# Patient Record
Sex: Female | Born: 1960 | Race: White | Hispanic: No | Marital: Married | Smoking: Never smoker
Health system: Southern US, Community
[De-identification: ages and names within clinical notes are randomized; demographics above are authoritative.]

## PROBLEM LIST (undated history)

## (undated) DIAGNOSIS — B019 Varicella without complication: Secondary | ICD-10-CM

## (undated) DIAGNOSIS — R51 Headache: Secondary | ICD-10-CM

## (undated) DIAGNOSIS — M1990 Unspecified osteoarthritis, unspecified site: Secondary | ICD-10-CM

## (undated) DIAGNOSIS — F329 Major depressive disorder, single episode, unspecified: Secondary | ICD-10-CM

## (undated) DIAGNOSIS — J439 Emphysema, unspecified: Secondary | ICD-10-CM

## (undated) DIAGNOSIS — F419 Anxiety disorder, unspecified: Secondary | ICD-10-CM

## (undated) DIAGNOSIS — T7840XA Allergy, unspecified, initial encounter: Secondary | ICD-10-CM

## (undated) DIAGNOSIS — D219 Benign neoplasm of connective and other soft tissue, unspecified: Secondary | ICD-10-CM

## (undated) DIAGNOSIS — E785 Hyperlipidemia, unspecified: Secondary | ICD-10-CM

## (undated) DIAGNOSIS — K219 Gastro-esophageal reflux disease without esophagitis: Secondary | ICD-10-CM

## (undated) DIAGNOSIS — F32A Depression, unspecified: Secondary | ICD-10-CM

## (undated) DIAGNOSIS — G43909 Migraine, unspecified, not intractable, without status migrainosus: Secondary | ICD-10-CM

## (undated) DIAGNOSIS — N809 Endometriosis, unspecified: Secondary | ICD-10-CM

## (undated) DIAGNOSIS — N83209 Unspecified ovarian cyst, unspecified side: Secondary | ICD-10-CM

## (undated) DIAGNOSIS — N390 Urinary tract infection, site not specified: Secondary | ICD-10-CM

## (undated) DIAGNOSIS — R519 Headache, unspecified: Secondary | ICD-10-CM

## (undated) HISTORY — DX: Anxiety disorder, unspecified: F41.9

## (undated) HISTORY — DX: Migraine, unspecified, not intractable, without status migrainosus: G43.909

## (undated) HISTORY — DX: Depression, unspecified: F32.A

## (undated) HISTORY — DX: Headache, unspecified: R51.9

## (undated) HISTORY — DX: Hyperlipidemia, unspecified: E78.5

## (undated) HISTORY — DX: Unspecified ovarian cyst, unspecified side: N83.209

## (undated) HISTORY — DX: Emphysema, unspecified: J43.9

## (undated) HISTORY — DX: Varicella without complication: B01.9

## (undated) HISTORY — DX: Gastro-esophageal reflux disease without esophagitis: K21.9

## (undated) HISTORY — DX: Allergy, unspecified, initial encounter: T78.40XA

## (undated) HISTORY — DX: Benign neoplasm of connective and other soft tissue, unspecified: D21.9

## (undated) HISTORY — DX: Headache: R51

## (undated) HISTORY — DX: Major depressive disorder, single episode, unspecified: F32.9

## (undated) HISTORY — DX: Unspecified osteoarthritis, unspecified site: M19.90

## (undated) HISTORY — DX: Urinary tract infection, site not specified: N39.0

## (undated) HISTORY — DX: Endometriosis, unspecified: N80.9

---

## 1996-06-25 HISTORY — PX: BUNIONECTOMY: SHX129

## 2000-11-25 HISTORY — PX: ABDOMINAL HYSTERECTOMY: SHX81

## 2006-09-04 DIAGNOSIS — H1045 Other chronic allergic conjunctivitis: Secondary | ICD-10-CM | POA: Insufficient documentation

## 2006-09-04 DIAGNOSIS — E782 Mixed hyperlipidemia: Secondary | ICD-10-CM | POA: Insufficient documentation

## 2006-09-04 DIAGNOSIS — N959 Unspecified menopausal and perimenopausal disorder: Secondary | ICD-10-CM | POA: Insufficient documentation

## 2006-09-04 DIAGNOSIS — L408 Other psoriasis: Secondary | ICD-10-CM | POA: Insufficient documentation

## 2008-11-14 DIAGNOSIS — E559 Vitamin D deficiency, unspecified: Secondary | ICD-10-CM | POA: Insufficient documentation

## 2016-02-22 DIAGNOSIS — M159 Polyosteoarthritis, unspecified: Secondary | ICD-10-CM | POA: Insufficient documentation

## 2016-05-08 LAB — BASIC METABOLIC PANEL
BUN: 12 (ref 4–21)
Creatinine: 0.8 (ref 0.5–1.1)
GLUCOSE: 87
Potassium: 4.2 (ref 3.4–5.3)
Sodium: 142 (ref 137–147)

## 2016-05-08 LAB — LIPID PANEL
Cholesterol: 172 (ref 0–200)
HDL: 50 (ref 35–70)
LDL CALC: 85
TRIGLYCERIDES: 183 — AB (ref 40–160)

## 2016-05-08 LAB — HEPATIC FUNCTION PANEL
ALK PHOS: 86 (ref 25–125)
ALT: 19 (ref 7–35)
AST: 19 (ref 13–35)
BILIRUBIN, TOTAL: 0.3

## 2018-01-07 ENCOUNTER — Encounter: Payer: Self-pay | Admitting: Family Medicine

## 2018-01-07 ENCOUNTER — Ambulatory Visit (INDEPENDENT_AMBULATORY_CARE_PROVIDER_SITE_OTHER): Payer: 59 | Admitting: Family Medicine

## 2018-01-07 VITALS — BP 134/80 | HR 80 | Ht 66.0 in | Wt 164.1 lb

## 2018-01-07 DIAGNOSIS — E7800 Pure hypercholesterolemia, unspecified: Secondary | ICD-10-CM

## 2018-01-07 DIAGNOSIS — Z0000 Encounter for general adult medical examination without abnormal findings: Secondary | ICD-10-CM

## 2018-01-07 LAB — LIPID PANEL
CHOL/HDL RATIO: 4
Cholesterol: 162 mg/dL (ref 0–200)
HDL: 45.4 mg/dL (ref >39.00)
LDL Cholesterol: 91 mg/dL (ref 0–99)
NONHDL: 117.05
Triglycerides: 131 mg/dL (ref 0.0–149.0)
VLDL: 26.2 mg/dL (ref 0.0–40.0)

## 2018-01-07 LAB — URINALYSIS, ROUTINE W REFLEX MICROSCOPIC
BILIRUBIN URINE: NEGATIVE
Ketones, ur: NEGATIVE
LEUKOCYTES UA: NEGATIVE
Nitrite: NEGATIVE
PH: 6.5 (ref 5.0–8.0)
RBC / HPF: NONE SEEN (ref 0–?)
Specific Gravity, Urine: <=1.005 — AB (ref 1.000–1.030)
TOTAL PROTEIN, URINE-UPE24: NEGATIVE
Urine Glucose: NEGATIVE
Urobilinogen, UA: 0.2 (ref 0.0–1.0)
WBC UA: NONE SEEN (ref 0–?)

## 2018-01-07 LAB — COMPREHENSIVE METABOLIC PANEL
ALT: 28 U/L (ref 0–35)
AST: 27 U/L (ref 0–37)
Albumin: 4.1 g/dL (ref 3.5–5.2)
Alkaline Phosphatase: 73 U/L (ref 39–117)
BUN: 14 mg/dL (ref 6–23)
CO2: 29 meq/L (ref 19–32)
Calcium: 9.1 mg/dL (ref 8.4–10.5)
Chloride: 101 mEq/L (ref 96–112)
Creatinine, Ser: 0.69 mg/dL (ref 0.40–1.20)
GFR: 92.83 mL/min (ref >60.00)
GLUCOSE: 91 mg/dL (ref 70–99)
POTASSIUM: 4.2 meq/L (ref 3.5–5.1)
Sodium: 136 mEq/L (ref 135–145)
Total Bilirubin: 0.5 mg/dL (ref 0.2–1.2)
Total Protein: 7.2 g/dL (ref 6.0–8.3)

## 2018-01-07 LAB — CBC
HCT: 39.7 % (ref 36.0–46.0)
HEMOGLOBIN: 13.6 g/dL (ref 12.0–15.0)
MCHC: 34.2 g/dL (ref 30.0–36.0)
MCV: 89.9 fl (ref 78.0–100.0)
PLATELETS: 253 10*3/uL (ref 150.0–400.0)
RBC: 4.41 Mil/uL (ref 3.87–5.11)
RDW: 13 % (ref 11.5–15.5)
WBC: 5 10*3/uL (ref 4.0–10.5)

## 2018-01-07 LAB — TSH: TSH: 1.22 u[IU]/mL (ref 0.35–4.50)

## 2018-01-07 NOTE — Progress Notes (Addendum)
Subjective:  Patient ID: Tara Avery, female    DOB: 1960-12-25  Age: 57 y.o. MRN: [id number]  CC: Establish Care   HPI Tara Avery presents for establishment of care and for follow-up of her elevated cholesterol.  She has been taking Lipitor for ever.  She has never had a stroke or heart attack.  She has a strong family history of a vascular disease.  She has never smoked.  She does not drink alcohol or use illicit drugs.  She and her husband moved into this area recently from the [employer] area.  She is retired from office work.  Her husband continues to work in this area.  They have a 51 year old daughter who lives in [place].  Patient admits that she has gained some weight and is not currently exercising.  She has a past medical history of osteoarthritis involving her hands and knees.  She has a history of thoracic outlet syndrome.  She is status post hysterectomy and 5 years of Premarin therapy.  She is dealing with hot flashes.  She has a history of breast lumps that have been benign through ultrasound studies.  She had a colonoscopy in 2012 that was normal she is not due for another 2022.  She has a history of ongoing allergy and platelet damp weather we have been experiencing in this area.  She uses Zyrtec and Flonase for this.  She experiences pain and stiffness in her MCPs and her knees.  She has tested negative for rheumatoid arthritis.  She has seen a rheumatologist in the past and has been diagnosed with osteoarthritis.  Blood work from 2 years ago shows normal CMP and lipid profiles. History Tara Avery has a past medical history of Allergy, Arthritis, Chicken pox, Depression, Emphysema of lung ([place]), Frequent headaches, GERD (gastroesophageal reflux disease), Hyperlipidemia, Migraine, and Urinary tract infection.   She has a past surgical history that includes Bunionectomy (Left, 06/1996) and Abdominal hysterectomy (2002).   Her family history includes Alcohol abuse in  her father and mother; Arthritis in her mother and sister; Depression in her mother and sister; Diabetes in her sister; Early death in her father; Heart attack in her maternal grandfather and sister; Hyperlipidemia in her maternal grandfather, mother, and sister; Hypertension in her maternal grandfather, mother, and sister; Kidney disease in her mother; Miscarriages / Stillbirths in her sister; Stroke in her sister.She reports that  has never smoked. she has never used smokeless tobacco. She reports that she does not drink alcohol or use drugs.  Outpatient Medications Prior to Visit  Medication Sig Dispense Refill  . cetirizine (ZYRTEC) 10 MG tablet Take 10 mg by mouth daily.    . Cholecalciferol (VITAMIN D3) 2000 units TABS Take 1 tablet by mouth daily.    . diphenhydrAMINE (BENADRYL) 25 MG tablet Take 25 mg by mouth as needed.    . fluticasone (FLONASE) 50 MCG/ACT nasal spray Place 1 spray into both nostrils as needed for allergies or rhinitis.    Marland Kitchen ibuprofen (ADVIL,MOTRIN) 200 MG tablet Take 200 mg by mouth as needed.    . Multiple Vitamins-Minerals (CENTRUM WOMEN PO) Take 0.5 tablets by mouth daily.    . Simethicone (GAS-X PO) Take 1 tablet by mouth as needed.    Marland Kitchen atorvastatin (LIPITOR) 10 MG tablet Take 10 mg by mouth daily.     No facility-administered medications prior to visit.     ROS Review of Systems  Constitutional: Negative for chills, fatigue, fever and unexpected weight change.  HENT: Positive for congestion, postnasal drip and rhinorrhea.   Eyes: Negative for photophobia and visual disturbance.  Respiratory: Negative.   Cardiovascular: Negative.   Endocrine: Negative for polyphagia and polyuria.  Genitourinary: Negative.   Musculoskeletal: Positive for arthralgias.  Skin: Negative for rash.  Allergic/Immunologic: Negative for immunocompromised state.  Neurological: Negative for facial asymmetry, speech difficulty and weakness.  Hematological: Does not bruise/bleed  easily.  Psychiatric/Behavioral: Negative.     Objective:  BP 134/80 (BP Location: Left Arm, Patient Position: Sitting, Cuff Size: Normal)   Pulse 80   Ht 5\' 6"  (1.676 m)   Wt 164 lb 2 oz (74.4 kg)   SpO2 99%   BMI 26.49 kg/m   Physical Exam  Constitutional: She is oriented to person, place, and time. She appears well-developed and well-nourished. No distress.  HENT:  Head: Normocephalic and atraumatic.  Right Ear: Tympanic membrane, external ear and ear canal normal.  Left Ear: Tympanic membrane, external ear and ear canal normal.  Mouth/Throat: Oropharynx is clear and moist. No oropharyngeal exudate.  Eyes: Conjunctivae are normal. Pupils are equal, round, and reactive to light. Right eye exhibits no discharge. Left eye exhibits no discharge. No scleral icterus.  Neck: Neck supple. No JVD present. No tracheal deviation present. No thyromegaly present.  Cardiovascular: Normal rate, regular rhythm and normal heart sounds.  Pulmonary/Chest: Effort normal and breath sounds normal. No stridor.  Abdominal: Soft. Bowel sounds are normal.  Lymphadenopathy:    She has no cervical adenopathy.  Neurological: She is alert and oriented to person, place, and time.  Skin: Skin is dry. She is not diaphoretic.  Psychiatric: She has a normal mood and affect. Her behavior is normal.      Assessment & Plan:   Tara Avery was seen today for establish care.  Diagnoses and all orders for this visit:  Elevated LDL cholesterol level -     Comprehensive metabolic panel -     Lipid panel -     TSH -     atorvastatin (LIPITOR) 10 MG tablet; Take 1 tablet (10 mg total) by mouth daily.  Health care maintenance -     CBC -     Comprehensive metabolic panel -     Hepatitis C antibody -     HIV antibody -     TSH -     Urinalysis, Routine w reflex microscopic -     DG Bone Density; Future -     MM Digital Screening; Future   I have changed Tara Avery's atorvastatin. I am also having her  maintain her cetirizine, Vitamin D3, Multiple Vitamins-Minerals (CENTRUM WOMEN PO), ibuprofen, Simethicone (GAS-X PO), fluticasone, and diphenhydrAMINE.  Meds ordered this encounter  Medications  . atorvastatin (LIPITOR) 10 MG tablet    Sig: Take 1 tablet (10 mg total) by mouth daily.    Dispense:  100 tablet    Refill:  3   Health care maintenance orders are listed above.  She will schedule a Pap smear with her GYN provider of choice.   follow-up suggested will pending results of lab work.  She will need a refill on her Lipitor.  Suggested that she might develop some social outlets with her chosen faith community and/or the [facility].  Follow-up: Return if symptoms worsen or fail to improve.  Libby Maw, MD

## 2018-01-08 ENCOUNTER — Encounter: Payer: Self-pay | Admitting: Family Medicine

## 2018-01-08 LAB — HEPATITIS C ANTIBODY
HEP C AB: NONREACTIVE
SIGNAL TO CUT-OFF: 0.01 (ref <1.00)

## 2018-01-08 LAB — HIV ANTIBODY (ROUTINE TESTING W REFLEX): HIV: NONREACTIVE

## 2018-01-08 MED ORDER — ATORVASTATIN CALCIUM 10 MG PO TABS: 10.0000 mg | ORAL_TABLET | Freq: Every day | ORAL | 3 refills | Status: DC

## 2018-01-08 NOTE — Addendum Note (Signed)
Addended by: Jon Billings on: 01/08/2018 02:02 PM   Modules accepted: Orders

## 2018-01-16 ENCOUNTER — Inpatient Hospital Stay: Admission: RE | Admit: 2018-01-16 | Payer: 59 | Source: Ambulatory Visit

## 2018-06-25 ENCOUNTER — Encounter: Payer: Self-pay | Admitting: Family Medicine

## 2018-06-25 ENCOUNTER — Telehealth: Payer: Self-pay

## 2018-06-25 NOTE — Telephone Encounter (Signed)
Pt called back and is [facility]

## 2018-06-25 NOTE — Telephone Encounter (Signed)
Pt is checking on mammogram order that was placed in Feb 2019. Please call the pt.

## 2018-07-23 ENCOUNTER — Ambulatory Visit
Admission: RE | Admit: 2018-07-23 | Discharge: 2018-07-23 | Disposition: A | Discharge status: Home / Self Care | Payer: 59 | Source: Ambulatory Visit | Attending: Family Medicine | Admitting: Family Medicine

## 2018-07-23 DIAGNOSIS — Z0000 Encounter for general adult medical examination without abnormal findings: Secondary | ICD-10-CM

## 2018-09-08 ENCOUNTER — Encounter: Payer: Self-pay | Admitting: Family Medicine

## 2019-02-07 ENCOUNTER — Other Ambulatory Visit: Payer: Self-pay | Admitting: Family Medicine

## 2019-02-07 DIAGNOSIS — E7800 Pure hypercholesterolemia, unspecified: Secondary | ICD-10-CM

## 2019-03-03 ENCOUNTER — Other Ambulatory Visit: Payer: Self-pay | Admitting: Family Medicine

## 2019-03-03 DIAGNOSIS — E7800 Pure hypercholesterolemia, unspecified: Secondary | ICD-10-CM

## 2019-03-12 ENCOUNTER — Telehealth: Payer: Self-pay | Admitting: Family Medicine

## 2019-03-12 NOTE — Telephone Encounter (Signed)
Called patient but phone number is not in service. Calling to schedule virtual visit with Tara Avery.

## 2019-03-30 ENCOUNTER — Other Ambulatory Visit: Payer: Self-pay | Admitting: Family Medicine

## 2019-03-30 DIAGNOSIS — E7800 Pure hypercholesterolemia, unspecified: Secondary | ICD-10-CM

## 2019-05-15 ENCOUNTER — Other Ambulatory Visit: Payer: Self-pay | Admitting: Family Medicine

## 2019-05-15 DIAGNOSIS — E7800 Pure hypercholesterolemia, unspecified: Secondary | ICD-10-CM

## 2019-05-17 ENCOUNTER — Encounter: Payer: Self-pay | Admitting: Family Medicine

## 2019-05-20 ENCOUNTER — Other Ambulatory Visit: Payer: Self-pay

## 2019-05-20 ENCOUNTER — Ambulatory Visit (INDEPENDENT_AMBULATORY_CARE_PROVIDER_SITE_OTHER): Payer: 59 | Admitting: Family Medicine

## 2019-05-20 ENCOUNTER — Encounter: Payer: Self-pay | Admitting: Family Medicine

## 2019-05-20 ENCOUNTER — Ambulatory Visit (INDEPENDENT_AMBULATORY_CARE_PROVIDER_SITE_OTHER): Payer: 59

## 2019-05-20 VITALS — BP 128/80 | HR 87 | Ht 66.0 in | Wt 159.5 lb

## 2019-05-20 DIAGNOSIS — M5432 Sciatica, left side: Secondary | ICD-10-CM | POA: Insufficient documentation

## 2019-05-20 DIAGNOSIS — Z872 Personal history of diseases of the skin and subcutaneous tissue: Secondary | ICD-10-CM | POA: Diagnosis not present

## 2019-05-20 DIAGNOSIS — J309 Allergic rhinitis, unspecified: Secondary | ICD-10-CM | POA: Diagnosis not present

## 2019-05-20 DIAGNOSIS — Z862 Personal history of diseases of the blood and blood-forming organs and certain disorders involving the immune mechanism: Secondary | ICD-10-CM | POA: Insufficient documentation

## 2019-05-20 DIAGNOSIS — Z0000 Encounter for general adult medical examination without abnormal findings: Secondary | ICD-10-CM

## 2019-05-20 DIAGNOSIS — E7800 Pure hypercholesterolemia, unspecified: Secondary | ICD-10-CM

## 2019-05-20 DIAGNOSIS — M2550 Pain in unspecified joint: Secondary | ICD-10-CM | POA: Insufficient documentation

## 2019-05-20 MED ORDER — PREDNISONE 10 MG (48) PO TBPK: ORAL_TABLET | ORAL | 0 refills | Status: DC

## 2019-05-20 MED ORDER — ATORVASTATIN CALCIUM 10 MG PO TABS: 10.0000 mg | ORAL_TABLET | Freq: Every day | ORAL | 2 refills | Status: DC

## 2019-05-20 MED ORDER — LEVOCETIRIZINE DIHYDROCHLORIDE 5 MG PO TABS: 5.0000 mg | ORAL_TABLET | Freq: Every evening | ORAL | 1 refills | Status: DC

## 2019-05-20 NOTE — Addendum Note (Signed)
Addended by: Lynnea Ferrier on: 05/20/2019 11:12 AM   Modules accepted: Orders

## 2019-05-20 NOTE — Progress Notes (Signed)
Established Patient Office Visit  Subjective:  Patient ID: Tara Avery, female    DOB: Sep 25, 1961  Age: 58 y.o. MRN: [id number]  CC:  Chief Complaint  Patient presents with  . Annual Exam    HPI [name] presents for follow-up of multiple medical issues and complaints.  She does continue to take the atorvastatin at low dose without issue.  Cholesterol has been controlled.  She continues to have allergy rhinitis symptoms.  She is taking Zyrtec and Benadryl at at bedtime as needed.  She continues to use nasal saline and Flonase.  Unfortunately the Flonase is irritated her nose and she occasionally experiences epistaxis.  She uses it every third day with some help.  She is having multiple joint aches and pains to include her hands her knees or feet among others.  There is some stiffness in the morning but not excessive.  Has tested negative in the past for rheumatoid arthritis.  She does have a past medical history of psoriasis that is currently quiesced sent.  She has a past medical history of sarcoidosis she tells me that was treated briefly with prednisone when she was in her 28s.  She has a history of granuloma annulare.  She has been having some problems with lower back pain radiating down around her left hip.  She has been stressed and sad with the current pandemic but denies frank depression.  She has been having troubles with some vaginal dryness and urinary incontinence.  She is also having hot flashes.  She has taken estrogen in the past.  She is status post hysterectomy.  No GYN follow-up in 3 years.  Past Medical History:  Diagnosis Date  . Allergy   . Arthritis   . Chicken pox   . Depression   . Emphysema of lung ([place])    when young  . Frequent headaches   . GERD (gastroesophageal reflux disease)   . Hyperlipidemia   . Migraine   . Urinary tract infection     Past Surgical History:  Procedure Laterality Date  . ABDOMINAL HYSTERECTOMY  2002   partial  .  BUNIONECTOMY Left 06/1996    Family History  Problem Relation Age of Onset  . Alcohol abuse Mother   . Arthritis Mother   . Depression Mother   . Hyperlipidemia Mother   . Hypertension Mother   . Kidney disease Mother   . Alcohol abuse Father   . Early death Father   . Depression Sister   . Diabetes Sister   . Heart attack Sister   . Hyperlipidemia Sister   . Hypertension Sister   . Miscarriages / Stillbirths Sister   . Stroke Sister   . Heart attack Maternal Grandfather   . Hypertension Maternal Grandfather   . Hyperlipidemia Maternal Grandfather   . Arthritis Sister     Social History   Socioeconomic History  . Marital status: Married    Spouse name: Not on file  . Number of children: Not on file  . Years of education: Not on file  . Highest education level: Not on file  Occupational History  . Not on file  Social Needs  . Financial resource strain: Not on file  . Food insecurity    Worry: Not on file    Inability: Not on file  . Transportation needs    Medical: Not on file    Non-medical: Not on file  Tobacco Use  . Smoking status: Never Smoker  . Smokeless tobacco: Never  Used  Substance and Sexual Activity  . Alcohol use: No    Frequency: Never  . Drug use: No  . Sexual activity: Yes    Partners: Male  Lifestyle  . Physical activity    Days per week: Not on file    Minutes per session: Not on file  . Stress: Not on file  Relationships  . Social Herbalist on phone: Not on file    Gets together: Not on file    Attends religious service: Not on file    Active member of club or organization: Not on file    Attends meetings of clubs or organizations: Not on file    Relationship status: Not on file  . Intimate partner violence    Fear of current or ex partner: Not on file    Emotionally abused: Not on file    Physically abused: Not on file    Forced sexual activity: Not on file  Other Topics Concern  . Not on file  Social History  Narrative  . Not on file    Outpatient Medications Prior to Visit  Medication Sig Dispense Refill  . cetirizine (ZYRTEC) 10 MG tablet Take 10 mg by mouth daily.    . Cholecalciferol (VITAMIN D3) 2000 units TABS Take 1 tablet by mouth daily.    . diphenhydrAMINE (BENADRYL) 25 MG tablet Take 25 mg by mouth as needed.    . fluticasone (FLONASE) 50 MCG/ACT nasal spray Place 1 spray into both nostrils as needed for allergies or rhinitis.    Marland Kitchen ibuprofen (ADVIL,MOTRIN) 200 MG tablet Take 200 mg by mouth as needed.    . Multiple Vitamins-Minerals (CENTRUM WOMEN PO) Take 0.5 tablets by mouth daily.    . Simethicone (GAS-X PO) Take 1 tablet by mouth as needed.    Marland Kitchen atorvastatin (LIPITOR) 10 MG tablet TAKE 1 TABLET BY MOUTH EVERY DAY 30 tablet 1   No facility-administered medications prior to visit.     Allergies  Allergen Reactions  . Codeine   . Latex     ROS Review of Systems  Constitutional: Negative for chills, diaphoresis, fatigue, fever and unexpected weight change.  HENT: Positive for nosebleeds, postnasal drip, rhinorrhea and sneezing. Negative for sinus pressure, sinus pain and sore throat.   Eyes: Negative for photophobia and visual disturbance.  Respiratory: Positive for cough. Negative for chest tightness and shortness of breath.   Cardiovascular: Negative.   Gastrointestinal: Negative.   Endocrine: Negative for polyphagia and polyuria.  Genitourinary: Negative.   Musculoskeletal: Positive for arthralgias, back pain and myalgias. Negative for joint swelling.  Skin: Positive for rash. Negative for color change.  Allergic/Immunologic: Negative for immunocompromised state.  Neurological: Negative for light-headedness, numbness and headaches.  Hematological: Does not bruise/bleed easily.  Psychiatric/Behavioral: Positive for dysphoric mood.   Depression screen [facility] 2/9 05/20/2019  Decreased Interest 1  Down, Depressed, Hopeless 0  PHQ - 2 Score 1  Altered sleeping 1  Tired,  decreased energy 1  Change in appetite 1  Feeling bad or failure about yourself  0  Trouble concentrating 1  Moving slowly or fidgety/restless 0  Suicidal thoughts 0  PHQ-9 Score 5      Objective:    Physical Exam  Constitutional: She is oriented to person, place, and time. She appears well-developed and well-nourished. No distress.  HENT:  Head: Normocephalic and atraumatic.  Right Ear: External ear normal.  Left Ear: External ear normal.  Mouth/Throat: Oropharynx is clear and moist.  No oropharyngeal exudate.  Eyes: Pupils are equal, round, and reactive to light. Conjunctivae are normal. Right eye exhibits no discharge. Left eye exhibits no discharge. No scleral icterus.  Neck: Neck supple. No JVD present. No tracheal deviation present. No thyromegaly present.  Cardiovascular: Normal rate, regular rhythm and normal heart sounds.  Pulmonary/Chest: Effort normal and breath sounds normal. No stridor.  Abdominal: Bowel sounds are normal.  Musculoskeletal:        General: No edema.     Lumbar back: She exhibits normal range of motion, no tenderness and no bony tenderness.  Lymphadenopathy:    She has no cervical adenopathy.  Neurological: She is alert and oriented to person, place, and time. She has normal strength.  Reflex Scores:      Patellar reflexes are 2+ on the right side and 2+ on the left side.      Achilles reflexes are 2+ on the right side and 2+ on the left side. Neg dural tension signs.    Skin: Skin is warm and dry. She is not diaphoretic.  Psychiatric: She has a normal mood and affect. Her behavior is normal.    BP 128/80   Pulse 87   Ht 5\' 6"  (1.676 m)   Wt 159 lb 8 oz (72.3 kg)   SpO2 99%   BMI 25.74 kg/m  Wt Readings from Last 3 Encounters:  05/20/19 159 lb 8 oz (72.3 kg)  01/07/18 164 lb 2 oz (74.4 kg)   BP Readings from Last 3 Encounters:  05/20/19 128/80  01/07/18 134/80   Guideline developer:  UpToDate (see UpToDate for funding source) Date  Released: June 2014  Health Maintenance Due  Topic Date Due  . Samul Dada  11/10/1978  . PAP SMEAR-Modifier  11/25/2017    There are no preventive care reminders to display for this patient.  Lab Results  Component Value Date   TSH 1.22 01/07/2018   Lab Results  Component Value Date   WBC 5.0 01/07/2018   HGB 13.6 01/07/2018   HCT 39.7 01/07/2018   MCV 89.9 01/07/2018   PLT 253.0 01/07/2018   Lab Results  Component Value Date   NA 136 01/07/2018   K 4.2 01/07/2018   CO2 29 01/07/2018   GLUCOSE 91 01/07/2018   BUN 14 01/07/2018   CREATININE 0.69 01/07/2018   BILITOT 0.5 01/07/2018   ALKPHOS 73 01/07/2018   AST 27 01/07/2018   ALT 28 01/07/2018   PROT 7.2 01/07/2018   ALBUMIN 4.1 01/07/2018   CALCIUM 9.1 01/07/2018   GFR 92.83 01/07/2018   Lab Results  Component Value Date   CHOL 162 01/07/2018   Lab Results  Component Value Date   HDL 45.40 01/07/2018   Lab Results  Component Value Date   LDLCALC 91 01/07/2018   Lab Results  Component Value Date   TRIG 131.0 01/07/2018   Lab Results  Component Value Date   CHOLHDL 4 01/07/2018   No results found for: HGBA1C    Assessment & Plan:   Problem List Items Addressed This Visit      Respiratory   Allergic rhinitis   Relevant Medications   levocetirizine (XYZAL) 5 MG tablet   predniSONE (STERAPRED UNI-PAK 48 TAB) 10 MG (48) TBPK tablet   Other Relevant Orders   CBC     Nervous and Auditory   Sciatica of left side   Relevant Medications   predniSONE (STERAPRED UNI-PAK 48 TAB) 10 MG (48) TBPK tablet     Other  Elevated LDL cholesterol level - Primary   Relevant Medications   atorvastatin (LIPITOR) 10 MG tablet   Other Relevant Orders   Comprehensive metabolic panel   Lipid panel   Healthcare maintenance   Relevant Orders   Ambulatory referral to Gynecology   History of psoriasis   Relevant Orders   Ambulatory referral to Rheumatology   Sedimentation rate   History of sarcoidosis    Relevant Orders   DG Chest 2 View   Sedimentation rate   Angiotensin converting enzyme   Arthralgia   Relevant Medications   predniSONE (STERAPRED UNI-PAK 48 TAB) 10 MG (48) TBPK tablet   Other Relevant Orders   Ambulatory referral to Rheumatology   CBC   Comprehensive metabolic panel   Urinalysis, Routine w reflex microscopic   VITAMIN D 25 Hydroxy (Vit-D Deficiency, Fractures)   Rheumatoid Factor   B. burgdorfi antibodies      Meds ordered this encounter  Medications  . levocetirizine (XYZAL) 5 MG tablet    Sig: Take 1 tablet (5 mg total) by mouth every evening.    Dispense:  90 tablet    Refill:  1  . predniSONE (STERAPRED UNI-PAK 48 TAB) 10 MG (48) TBPK tablet    Sig: Pharm to instruct a 12 day dose pack    Dispense:  48 tablet    Refill:  0  . atorvastatin (LIPITOR) 10 MG tablet    Sig: Take 1 tablet (10 mg total) by mouth daily.    Dispense:  100 tablet    Refill:  2    Follow-up: Return in about 1 month (around 06/19/2019), or if symptoms worsen or fail to improve.

## 2019-05-21 ENCOUNTER — Telehealth: Payer: Self-pay | Admitting: Family Medicine

## 2019-05-21 LAB — CBC
HCT: 37.5 % (ref 35.0–45.0)
Hemoglobin: 12.8 g/dL (ref 11.7–15.5)
MCH: 30.3 pg (ref 27.0–33.0)
MCHC: 34.1 g/dL (ref 32.0–36.0)
MCV: 88.7 fL (ref 80.0–100.0)
MPV: 10.5 fL (ref 7.5–12.5)
Platelets: 285 10*3/uL (ref 140–400)
RBC: 4.23 10*6/uL (ref 3.80–5.10)
RDW: 12.5 % (ref 11.0–15.0)
WBC: 6 10*3/uL (ref 3.8–10.8)

## 2019-05-21 LAB — COMPREHENSIVE METABOLIC PANEL
AG Ratio: 1.7 (calc) (ref 1.0–2.5)
ALT: 18 U/L (ref 6–29)
AST: 19 U/L (ref 10–35)
Albumin: 4.3 g/dL (ref 3.6–5.1)
Alkaline phosphatase (APISO): 71 U/L (ref 37–153)
BUN: 14 mg/dL (ref 7–25)
CO2: 27 mmol/L (ref 20–32)
Calcium: 9.4 mg/dL (ref 8.6–10.4)
Chloride: 105 mmol/L (ref 98–110)
Creat: 0.64 mg/dL (ref 0.50–1.05)
Globulin: 2.6 g/dL (calc) (ref 1.9–3.7)
Glucose, Bld: 86 mg/dL (ref 65–99)
Potassium: 4.8 mmol/L (ref 3.5–5.3)
Sodium: 141 mmol/L (ref 135–146)
Total Bilirubin: 0.4 mg/dL (ref 0.2–1.2)
Total Protein: 6.9 g/dL (ref 6.1–8.1)

## 2019-05-21 LAB — URINALYSIS, ROUTINE W REFLEX MICROSCOPIC
Bacteria, UA: NONE SEEN /HPF
Bilirubin Urine: NEGATIVE
Glucose, UA: NEGATIVE
Hyaline Cast: NONE SEEN /LPF
Ketones, ur: NEGATIVE
Leukocytes,Ua: NEGATIVE
Nitrite: NEGATIVE
Protein, ur: NEGATIVE
RBC / HPF: NONE SEEN /HPF (ref 0–2)
Specific Gravity, Urine: 1.004 (ref 1.001–1.03)
Squamous Epithelial / HPF: NONE SEEN /HPF (ref <=5)
WBC, UA: NONE SEEN /HPF (ref 0–5)
pH: 6.5 (ref 5.0–8.0)

## 2019-05-21 LAB — VITAMIN D 25 HYDROXY (VIT D DEFICIENCY, FRACTURES): Vit D, 25-Hydroxy: 42 ng/mL (ref 30–100)

## 2019-05-21 LAB — LIPID PANEL
Cholesterol: 162 mg/dL (ref <200)
HDL: 44 mg/dL — ABNORMAL LOW (ref >=50)
LDL Cholesterol (Calc): 89 mg/dL (calc)
Non-HDL Cholesterol (Calc): 118 mg/dL (calc) (ref <130)
Total CHOL/HDL Ratio: 3.7 (calc) (ref <5.0)
Triglycerides: 195 mg/dL — ABNORMAL HIGH (ref <150)

## 2019-05-21 LAB — SEDIMENTATION RATE: Sed Rate: 14 mm/h (ref 0–30)

## 2019-05-21 LAB — RHEUMATOID FACTOR: Rheumatoid fact SerPl-aCnc: <14 IU/mL (ref <14)

## 2019-05-21 LAB — B. BURGDORFI ANTIBODIES: B burgdorferi Ab IgG+IgM: <0.9 index

## 2019-05-21 LAB — ANGIOTENSIN CONVERTING ENZYME: Angiotensin-Converting Enzyme: 41 U/L (ref 9–67)

## 2019-05-21 NOTE — Telephone Encounter (Signed)
Pt given lab/xray results. Pt verbalized understanding.

## 2019-05-21 NOTE — Telephone Encounter (Signed)
Copied from [place] [id number]. Topic: Quick Communication - Other Results (Clinic Use ONLY) >> May 21, 2019 12:48 PM Pauline Good wrote: Pt returning  call to nurse for xray results

## 2019-05-25 ENCOUNTER — Encounter: Payer: Self-pay | Admitting: Family Medicine

## 2019-06-03 ENCOUNTER — Encounter: Payer: 59 | Admitting: Family Medicine
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
## 2019-07-07 ENCOUNTER — Ambulatory Visit (INDEPENDENT_AMBULATORY_CARE_PROVIDER_SITE_OTHER): Payer: 59

## 2019-07-07 ENCOUNTER — Ambulatory Visit: Payer: Self-pay

## 2019-07-07 ENCOUNTER — Ambulatory Visit (INDEPENDENT_AMBULATORY_CARE_PROVIDER_SITE_OTHER): Payer: 59 | Admitting: Rheumatology

## 2019-07-07 ENCOUNTER — Encounter: Payer: Self-pay | Admitting: Rheumatology

## 2019-07-07 ENCOUNTER — Other Ambulatory Visit: Payer: Self-pay

## 2019-07-07 VITALS — BP 163/92 | HR 75 | Resp 13 | Ht 65.0 in | Wt 157.0 lb

## 2019-07-07 DIAGNOSIS — M533 Sacrococcygeal disorders, not elsewhere classified: Secondary | ICD-10-CM | POA: Diagnosis not present

## 2019-07-07 DIAGNOSIS — M25561 Pain in right knee: Secondary | ICD-10-CM | POA: Diagnosis not present

## 2019-07-07 DIAGNOSIS — M79672 Pain in left foot: Secondary | ICD-10-CM

## 2019-07-07 DIAGNOSIS — L920 Granuloma annulare: Secondary | ICD-10-CM

## 2019-07-07 DIAGNOSIS — M25562 Pain in left knee: Secondary | ICD-10-CM | POA: Diagnosis not present

## 2019-07-07 DIAGNOSIS — Z862 Personal history of diseases of the blood and blood-forming organs and certain disorders involving the immune mechanism: Secondary | ICD-10-CM

## 2019-07-07 DIAGNOSIS — G8929 Other chronic pain: Secondary | ICD-10-CM | POA: Diagnosis not present

## 2019-07-07 DIAGNOSIS — M79671 Pain in right foot: Secondary | ICD-10-CM

## 2019-07-07 DIAGNOSIS — M79641 Pain in right hand: Secondary | ICD-10-CM

## 2019-07-07 DIAGNOSIS — E7800 Pure hypercholesterolemia, unspecified: Secondary | ICD-10-CM

## 2019-07-07 DIAGNOSIS — M5442 Lumbago with sciatica, left side: Secondary | ICD-10-CM

## 2019-07-07 DIAGNOSIS — L409 Psoriasis, unspecified: Secondary | ICD-10-CM

## 2019-07-07 DIAGNOSIS — M79642 Pain in left hand: Secondary | ICD-10-CM | POA: Diagnosis not present

## 2019-07-07 DIAGNOSIS — G540 Brachial plexus disorders: Secondary | ICD-10-CM

## 2019-07-07 NOTE — Patient Instructions (Signed)
Back Exercises The following exercises strengthen the muscles that help to support the trunk and back. They also help to keep the lower back flexible. Doing these exercises can help to prevent back pain or lessen existing pain.  If you have back pain or discomfort, try doing these exercises 2-3 times each day or as told by your health care provider.  As your pain improves, do them once each day, but increase the number of times that you repeat the steps for each exercise (do more repetitions).  To prevent the recurrence of back pain, continue to do these exercises once each day or as told by your health care provider. Do exercises exactly as told by your health care provider and adjust them as directed. It is normal to feel mild stretching, pulling, tightness, or discomfort as you do these exercises, but you should stop right away if you feel sudden pain or your pain gets worse. Exercises Single knee to chest Repeat these steps 3-5 times for each leg: 1. Lie on your back on a firm bed or the floor with your legs extended. 2. Bring one knee to your chest. Your other leg should stay extended and in contact with the floor. 3. Hold your knee in place by grabbing your knee or thigh with both hands and hold. 4. Pull on your knee until you feel a gentle stretch in your lower back or buttocks. 5. Hold the stretch for 10-30 seconds. 6. Slowly release and straighten your leg. Pelvic tilt Repeat these steps 5-10 times: 1. Lie on your back on a firm bed or the floor with your legs extended. 2. Bend your knees so they are pointing toward the ceiling and your feet are flat on the floor. 3. Tighten your lower abdominal muscles to press your lower back against the floor. This motion will tilt your pelvis so your tailbone points up toward the ceiling instead of pointing to your feet or the floor. 4. With gentle tension and even breathing, hold this position for 5-10 seconds. Cat-cow Repeat these steps until  your lower back becomes more flexible: 1. Get into a hands-and-knees position on a firm surface. Keep your hands under your shoulders, and keep your knees under your hips. You may place padding under your knees for comfort. 2. Let your head hang down toward your chest. Contract your abdominal muscles and point your tailbone toward the floor so your lower back becomes rounded like the back of a cat. 3. Hold this position for 5 seconds. 4. Slowly lift your head, let your abdominal muscles relax and point your tailbone up toward the ceiling so your back forms a sagging arch like the back of a cow. 5. Hold this position for 5 seconds.  Press-ups Repeat these steps 5-10 times: 1. Lie on your abdomen (face-down) on the floor. 2. Place your palms near your head, about shoulder-width apart. 3. Keeping your back as relaxed as possible and keeping your hips on the floor, slowly straighten your arms to raise the top half of your body and lift your shoulders. Do not use your back muscles to raise your upper torso. You may adjust the placement of your hands to make yourself more comfortable. 4. Hold this position for 5 seconds while you keep your back relaxed. 5. Slowly return to lying flat on the floor.  Bridges Repeat these steps 10 times: 1. Lie on your back on a firm surface. 2. Bend your knees so they are pointing toward the ceiling and   your feet are flat on the floor. Your arms should be flat at your sides, next to your body. 3. Tighten your buttocks muscles and lift your buttocks off the floor until your waist is at almost the same height as your knees. You should feel the muscles working in your buttocks and the back of your thighs. If you do not feel these muscles, slide your feet 1-2 inches farther away from your buttocks. 4. Hold this position for 3-5 seconds. 5. Slowly lower your hips to the starting position, and allow your buttocks muscles to relax completely. If this exercise is too easy, try  doing it with your arms crossed over your chest. Abdominal crunches Repeat these steps 5-10 times: 1. Lie on your back on a firm bed or the floor with your legs extended. 2. Bend your knees so they are pointing toward the ceiling and your feet are flat on the floor. 3. Cross your arms over your chest. 4. Tip your chin slightly toward your chest without bending your neck. 5. Tighten your abdominal muscles and slowly raise your trunk (torso) high enough to lift your shoulder blades a tiny bit off the floor. Avoid raising your torso higher than that because it can put too much stress on your low back and does not help to strengthen your abdominal muscles. 6. Slowly return to your starting position. Back lifts Repeat these steps 5-10 times: 1. Lie on your abdomen (face-down) with your arms at your sides, and rest your forehead on the floor. 2. Tighten the muscles in your legs and your buttocks. 3. Slowly lift your chest off the floor while you keep your hips pressed to the floor. Keep the back of your head in line with the curve in your back. Your eyes should be looking at the floor. 4. Hold this position for 3-5 seconds. 5. Slowly return to your starting position. Contact a health care provider if:  Your back pain or discomfort gets much worse when you do an exercise.  Your worsening back pain or discomfort does not lessen within 2 hours after you exercise. If you have any of these problems, stop doing these exercises right away. Do not do them again unless your health care provider says that you can. Get help right away if:  You develop sudden, severe back pain. If this happens, stop doing the exercises right away. Do not do them again unless your health care provider says that you can. This information is not intended to replace advice given to you by your health care provider. Make sure you discuss any questions you have with your health care provider. Document Released: 12/19/2004 Document  Revised: 03/18/2019 Document Reviewed: 08/13/2018 [employer] Patient Education  [place]. Hand Exercises Hand exercises can be helpful for almost anyone. These exercises can strengthen the hands, improve flexibility and movement, and increase blood flow to the hands. These results can make work and daily tasks easier. Hand exercises can be especially helpful for people who have joint pain from arthritis or have nerve damage from overuse (carpal tunnel syndrome). These exercises can also help people who have injured a hand. Exercises Most of these hand exercises are gentle stretching and motion exercises. It is usually safe to do them often throughout the day. Warming up your hands before exercise may help to reduce stiffness. You can do this with gentle massage or by placing your hands in warm water for 10-15 minutes. It is normal to feel some stretching, pulling, tightness, or  mild discomfort as you begin new exercises. This will gradually improve. Stop an exercise right away if you feel sudden, severe pain or your pain gets worse. Ask your health care provider which exercises are best for you. Knuckle bend or "claw" fist 1. Stand or sit with your arm, hand, and all five fingers pointed straight up. Make sure to keep your wrist straight during the exercise. 2. Gently bend your fingers down toward your palm until the tips of your fingers are touching the top of your palm. Keep your big knuckle straight and just bend the small knuckles in your fingers. 3. Hold this position for __________ seconds. 4. Straighten (extend) your fingers back to the starting position. Repeat this exercise 5-10 times with each hand. Full finger fist 1. Stand or sit with your arm, hand, and all five fingers pointed straight up. Make sure to keep your wrist straight during the exercise. 2. Gently bend your fingers into your palm until the tips of your fingers are touching the middle of your palm. 3. Hold this position  for __________ seconds. 4. Extend your fingers back to the starting position, stretching every joint fully. Repeat this exercise 5-10 times with each hand. Straight fist 1. Stand or sit with your arm, hand, and all five fingers pointed straight up. Make sure to keep your wrist straight during the exercise. 2. Gently bend your fingers at the big knuckle, where your fingers meet your hand, and the middle knuckle. Keep the knuckle at the tips of your fingers straight and try to touch the bottom of your palm. 3. Hold this position for __________ seconds. 4. Extend your fingers back to the starting position, stretching every joint fully. Repeat this exercise 5-10 times with each hand. Tabletop 1. Stand or sit with your arm, hand, and all five fingers pointed straight up. Make sure to keep your wrist straight during the exercise. 2. Gently bend your fingers at the big knuckle, where your fingers meet your hand, as far down as you can while keeping the small knuckles in your fingers straight. Think of forming a tabletop with your fingers. 3. Hold this position for __________ seconds. 4. Extend your fingers back to the starting position, stretching every joint fully. Repeat this exercise 5-10 times with each hand. Finger spread 1. Place your hand flat on a table with your palm facing down. Make sure your wrist stays straight as you do this exercise. 2. Spread your fingers and thumb apart from each other as far as you can until you feel a gentle stretch. Hold this position for __________ seconds. 3. Bring your fingers and thumb tight together again. Hold this position for __________ seconds. Repeat this exercise 5-10 times with each hand. Making circles 1. Stand or sit with your arm, hand, and all five fingers pointed straight up. Make sure to keep your wrist straight during the exercise. 2. Make a circle by touching the tip of your thumb to the tip of your index finger. 3. Hold for __________ seconds.  Then open your hand wide. 4. Repeat this motion with your thumb and each finger on your hand. Repeat this exercise 5-10 times with each hand. Thumb motion 1. Sit with your forearm resting on a table and your wrist straight. Your thumb should be facing up toward the ceiling. Keep your fingers relaxed as you move your thumb. 2. Lift your thumb up as high as you can toward the ceiling. Hold for __________ seconds. 3. Bend your thumb across  your palm as far as you can, reaching the tip of your thumb for the small finger (pinkie) side of your palm. Hold for __________ seconds. Repeat this exercise 5-10 times with each hand. Grip strengthening  1. Hold a stress ball or other soft ball in the middle of your hand. 2. Slowly increase the pressure, squeezing the ball as much as you can without causing pain. Think of bringing the tips of your fingers into the middle of your palm. All of your finger joints should bend when doing this exercise. 3. Hold your squeeze for __________ seconds, then relax. Repeat this exercise 5-10 times with each hand. Contact a health care provider if:  Your hand pain or discomfort gets much worse when you do an exercise.  Your hand pain or discomfort does not improve within 2 hours after you exercise. If you have any of these problems, stop doing these exercises right away. Do not do them again unless your health care provider says that you can. Get help right away if:  You develop sudden, severe hand pain or swelling. If this happens, stop doing these exercises right away. Do not do them again unless your health care provider says that you can. This information is not intended to replace advice given to you by your health care provider. Make sure you discuss any questions you have with your health care provider. Document Released: 10/23/2015 Document Revised: 03/04/2019 Document Reviewed: 11/12/2018 Elsevier Patient Education  2020 [place] [employer], 15(4), [phone]. Retrieved August 31, 2018 from [email or web address].com/[email or web address]">  Knee Exercises Ask your health care provider which exercises are safe for you. Do exercises exactly as told by your health care provider and adjust them as directed. It is normal to feel mild stretching, pulling, tightness, or discomfort as you do these exercises. Stop right away if you feel sudden pain or your pain gets worse. Do not begin these exercises until told by your health care provider. Stretching and range-of-motion exercises These exercises warm up your muscles and joints and improve the movement and flexibility of your knee. These exercises also help to relieve pain and swelling. Knee extension, prone 5. Lie on your abdomen (prone position) on a bed. 6. Place your left / right knee just beyond the edge of the surface so your knee is not on the bed. You can put a towel under your left / right thigh just above your kneecap for comfort. 7. Relax your leg muscles and allow gravity to straighten your knee (extension). You should feel a stretch behind your left / right knee. 8. Hold this position for __________ seconds. 9. Scoot up so your knee is supported between repetitions. Repeat __________ times. Complete this exercise __________ times a day. Knee flexion, active  5. Lie on your back with both legs straight. If this causes back discomfort, bend your left / right knee so your foot is flat on the floor. 6. Slowly slide your left / right heel back toward your buttocks. Stop when you feel a gentle stretch in the front of your knee or thigh (flexion). 7. Hold this position for __________ seconds. 8. Slowly slide your left / right heel back to the starting position. Repeat __________ times. Complete this exercise __________ times a day. Quadriceps stretch, prone  5. Lie on your abdomen on a firm surface, such as a bed or padded floor. 6. Bend your left / right knee and hold your ankle. If you  cannot reach your ankle or pant  leg, loop a belt around your foot and grab the belt instead. 7. Gently pull your heel toward your buttocks. Your knee should not slide out to the side. You should feel a stretch in the front of your thigh and knee (quadriceps). 8. Hold this position for __________ seconds. Repeat __________ times. Complete this exercise __________ times a day. Hamstring, supine 1. Lie on your back (supine position). 2. Loop a belt or towel over the ball of your left / right foot. The ball of your foot is on the walking surface, right under your toes. 3. Straighten your left / right knee and slowly pull on the belt to raise your leg until you feel a gentle stretch behind your knee (hamstring). ? Do not let your knee bend while you do this. ? Keep your other leg flat on the floor. 4. Hold this position for __________ seconds. Repeat __________ times. Complete this exercise __________ times a day. Strengthening exercises These exercises build strength and endurance in your knee. Endurance is the ability to use your muscles for a long time, even after they get tired. Quadriceps, isometric This exercise stretches the muscles in front of your thigh (quadriceps) without moving your knee joint (isometric). 4. Lie on your back with your left / right leg extended and your other knee bent. Put a rolled towel or small pillow under your knee if told by your health care provider. 5. Slowly tense the muscles in the front of your left / right thigh. You should see your kneecap slide up toward your hip or see increased dimpling just above the knee. This motion will push the back of the knee toward the floor. 6. For __________ seconds, hold the muscle as tight as you can without increasing your pain. 7. Relax the muscles slowly and completely. Repeat __________ times. Complete this exercise __________ times a day. Straight leg raises This exercise stretches the muscles in front of your thigh  (quadriceps) and the muscles that move your hips (hip flexors). 5. Lie on your back with your left / right leg extended and your other knee bent. 6. Tense the muscles in the front of your left / right thigh. You should see your kneecap slide up or see increased dimpling just above the knee. Your thigh may even shake a bit. 7. Keep these muscles tight as you raise your leg 4-6 inches (10-15 cm) off the floor. Do not let your knee bend. 8. Hold this position for __________ seconds. 9. Keep these muscles tense as you lower your leg. 10. Relax your muscles slowly and completely after each repetition. Repeat __________ times. Complete this exercise __________ times a day. Hamstring, isometric 4. Lie on your back on a firm surface. 5. Bend your left / right knee about __________ degrees. 6. Dig your left / right heel into the surface as if you are trying to pull it toward your buttocks. Tighten the muscles in the back of your thighs (hamstring) to "dig" as hard as you can without increasing any pain. 7. Hold this position for __________ seconds. 8. Release the tension gradually and allow your muscles to relax completely for __________ seconds after each repetition. Repeat __________ times. Complete this exercise __________ times a day. Hamstring curls If told by your health care provider, do this exercise while wearing ankle weights. Begin with __________ lb weights. Then increase the weight by 1 lb (0.5 kg) increments. Do not wear ankle weights that are more than __________ lb. 4. Lie on your  abdomen with your legs straight. 5. Bend your left / right knee as far as you can without feeling pain. Keep your hips flat against the floor. 6. Hold this position for __________ seconds. 7. Slowly lower your leg to the starting position. Repeat __________ times. Complete this exercise __________ times a day. Squats This exercise strengthens the muscles in front of your thigh and knee (quadriceps). 1. Stand  in front of a table, with your feet and knees pointing straight ahead. You may rest your hands on the table for balance but not for support. 2. Slowly bend your knees and lower your hips like you are going to sit in a chair. ? Keep your weight over your heels, not over your toes. ? Keep your lower legs upright so they are parallel with the table legs. ? Do not let your hips go lower than your knees. ? Do not bend lower than told by your health care provider. ? If your knee pain increases, do not bend as low. 3. Hold the squat position for __________ seconds. 4. Slowly push with your legs to return to standing. Do not use your hands to pull yourself to standing. Repeat __________ times. Complete this exercise __________ times a day. Wall slides This exercise strengthens the muscles in front of your thigh and knee (quadriceps). 1. Lean your back against a smooth wall or door, and walk your feet out 18-24 inches (46-61 cm) from it. 2. Place your feet hip-width apart. 3. Slowly slide down the wall or door until your knees bend __________ degrees. Keep your knees over your heels, not over your toes. Keep your knees in line with your hips. 4. Hold this position for __________ seconds. Repeat __________ times. Complete this exercise __________ times a day. Straight leg raises This exercise strengthens the muscles that rotate the leg at the hip and move it away from your body (hip abductors). 1. Lie on your side with your left / right leg in the top position. Lie so your head, shoulder, knee, and hip line up. You may bend your bottom knee to help you keep your balance. 2. Roll your hips slightly forward so your hips are stacked directly over each other and your left / right knee is facing forward. 3. Leading with your heel, lift your top leg 4-6 inches (10-15 cm). You should feel the muscles in your outer hip lifting. ? Do not let your foot drift forward. ? Do not let your knee roll toward the  ceiling. 4. Hold this position for __________ seconds. 5. Slowly return your leg to the starting position. 6. Let your muscles relax completely after each repetition. Repeat __________ times. Complete this exercise __________ times a day. Straight leg raises This exercise stretches the muscles that move your hips away from the front of the pelvis (hip extensors). 1. Lie on your abdomen on a firm surface. You can put a pillow under your hips if that is more comfortable. 2. Tense the muscles in your buttocks and lift your left / right leg about 4-6 inches (10-15 cm). Keep your knee straight as you lift your leg. 3. Hold this position for __________ seconds. 4. Slowly lower your leg to the starting position. 5. Let your leg relax completely after each repetition. Repeat __________ times. Complete this exercise __________ times a day. This information is not intended to replace advice given to you by your health care provider. Make sure you discuss any questions you have with your health care provider.  Document Released: 09/25/2005 Document Revised: 09/01/2018 Document Reviewed: 09/01/2018 Elsevier Patient Education  2020 [employer].

## 2019-07-21 ENCOUNTER — Other Ambulatory Visit: Payer: Self-pay

## 2019-07-21 ENCOUNTER — Ambulatory Visit: Payer: 59 | Admitting: Obstetrics & Gynecology

## 2019-07-21 ENCOUNTER — Encounter: Payer: Self-pay | Admitting: Obstetrics & Gynecology

## 2019-07-21 VITALS — BP 140/90 | Ht 64.5 in | Wt 156.0 lb

## 2019-07-21 DIAGNOSIS — Z780 Asymptomatic menopausal state: Secondary | ICD-10-CM

## 2019-07-21 DIAGNOSIS — Z01419 Encounter for gynecological examination (general) (routine) without abnormal findings: Secondary | ICD-10-CM | POA: Diagnosis not present

## 2019-07-21 DIAGNOSIS — Z90710 Acquired absence of both cervix and uterus: Secondary | ICD-10-CM

## 2019-07-21 DIAGNOSIS — Z13820 Encounter for screening for osteoporosis: Secondary | ICD-10-CM

## 2019-07-21 DIAGNOSIS — N952 Postmenopausal atrophic vaginitis: Secondary | ICD-10-CM

## 2019-07-21 NOTE — Progress Notes (Signed)
Tara Avery [birth year] [id number]   History:    58 y.o. G1P1L1  Married  RP:  New patient presenting for annual gyn exam   HPI: S/P TAH.  Menopause, well on no HRT except for vaginal dryness and pain with IC.  No pelvic pain otherwise.  Urine/BMs normal.  Breasts normal.  BMI 26.36.  Health labs with Fam MD.  Past medical history,surgical history, family history and social history were all reviewed and documented in the EPIC chart.  Gynecologic History No LMP recorded. Patient has had a hysterectomy. Contraception: status post hysterectomy Last Pap: 4 yrs ago, normal per patient Last mammogram: 06/2018. Results were: Negative Bone Density: Never Colonoscopy: 8 yrs ago  Obstetric History OB History  Gravida Para Term Preterm AB Living  1 1       1   SAB TAB Ectopic Multiple Live Births               # Outcome Date GA Lbr Len/2nd Weight Sex Delivery Anes PTL Lv  1 Para              ROS: A ROS was performed and pertinent positives and negatives are included in the history.  GENERAL: No fevers or chills. HEENT: No change in vision, no earache, sore throat or sinus congestion. NECK: No pain or stiffness. CARDIOVASCULAR: No chest pain or pressure. No palpitations. PULMONARY: No shortness of breath, cough or wheeze. GASTROINTESTINAL: No abdominal pain, nausea, vomiting or diarrhea, melena or bright red blood per rectum. GENITOURINARY: No urinary frequency, urgency, hesitancy or dysuria. MUSCULOSKELETAL: No joint or muscle pain, no back pain, no recent trauma. DERMATOLOGIC: No rash, no itching, no lesions. ENDOCRINE: No polyuria, polydipsia, no heat or cold intolerance. No recent change in weight. HEMATOLOGICAL: No anemia or easy bruising or bleeding. NEUROLOGIC: No headache, seizures, numbness, tingling or weakness. PSYCHIATRIC: No depression, no loss of interest in normal activity or change in sleep pattern.     Exam:   BP 140/90   Ht 5' 4.5" (1.638 m)   Wt 156 lb (70.8 kg)    BMI 26.36 kg/m   Body mass index is 26.36 kg/m.  General appearance : Well developed well nourished female. No acute distress HEENT: Eyes: no retinal hemorrhage or exudates,  Neck supple, trachea midline, no carotid bruits, no thyroidmegaly Lungs: Clear to auscultation, no rhonchi or wheezes, or rib retractions  Heart: Regular rate and rhythm, no murmurs or gallops Breast:Examined in sitting and supine position were symmetrical in appearance, no palpable masses or tenderness,  no skin retraction, no nipple inversion, no nipple discharge, no skin discoloration, no axillary or supraclavicular lymphadenopathy Abdomen: no palpable masses or tenderness, no rebound or guarding Extremities: no edema or skin discoloration or tenderness  Pelvic: Vulva: Normal             Vagina: No gross lesions or discharge.  Pap reflex done.  Cervix/Uterus absent  Adnexa  Without masses or tenderness  Anus: Normal   Assessment/Plan:  58 y.o. female for annual exam   1. Encounter for Papanicolaou smear of vagina as part of routine gynecological examination Gynecologic exam status post total hysterectomy and menopause.  Pap reflex done on the vaginal vault.  Breast exam normal.  Screening mammogram in August 2019 was negative, will have a screening mammogram this year soon.  Health labs with family physician.  Body mass index 26.36.  Continue with fitness and healthy nutrition.  Colonoscopy 8 years ago. - Pap IG w/ reflex to  HPV when ASC-U  2. S/P TAH (total abdominal hysterectomy)  3. Postmenopause Well on no hormone replacement therapy.  4. Postmenopausal atrophic vaginitis Will start on estradiol cream a quarter of an applicator twice a week with a small amount on the vulva twice a week.  Usage reviewed and prescription sent to pharmacy.  Can also use coconut oil as needed.  5. Screening for osteoporosis Schedule bone density here.  Vitamin D supplements, calcium intake of 1200 mg daily and regular  weightbearing physical activity is recommended. - DG Bone Density; Future  Other orders - estradiol (ESTRACE VAGINAL) 0.1 MG/GM vaginal cream; Place [id number] Applicatorfuls vaginally 2 (two) times a week.  Princess Bruins MD, 2:16 PM 07/21/2019

## 2019-07-22 ENCOUNTER — Encounter: Payer: Self-pay | Admitting: Obstetrics & Gynecology

## 2019-07-22 LAB — PAP IG W/ RFLX HPV ASCU

## 2019-07-22 MED ORDER — ESTRADIOL 0.1 MG/GM VA CREA: 0.2500 | TOPICAL_CREAM | VAGINAL | 4 refills | Status: DC

## 2019-07-22 NOTE — Patient Instructions (Signed)
1. Encounter for Papanicolaou smear of vagina as part of routine gynecological examination Gynecologic exam status post total hysterectomy and menopause.  Pap reflex done on the vaginal vault.  Breast exam normal.  Screening mammogram in August 2019 was negative, will have a screening mammogram this year soon.  Health labs with family physician.  Body mass index 26.36.  Continue with fitness and healthy nutrition.  Colonoscopy 8 years ago. - Pap IG w/ reflex to HPV when ASC-U  2. S/P TAH (total abdominal hysterectomy)  3. Postmenopause Well on no hormone replacement therapy.  4. Postmenopausal atrophic vaginitis Will start on estradiol cream a quarter of an applicator twice a week with a small amount on the vulva twice a week.  Usage reviewed and prescription sent to pharmacy.  Can also use coconut oil as needed.  5. Screening for osteoporosis Schedule bone density here.  Vitamin D supplements, calcium intake of 1200 mg daily and regular weightbearing physical activity is recommended. - DG Bone Density; Future  Other orders - estradiol (ESTRACE VAGINAL) 0.1 MG/GM vaginal cream; Place [id number] Applicatorfuls vaginally 2 (two) times a week.  Amillion, it was a pleasure seeing you today!  I will inform you of your results as soon as they are available.

## 2019-07-30 ENCOUNTER — Telehealth: Payer: Self-pay | Admitting: *Deleted

## 2019-07-30 NOTE — Telephone Encounter (Signed)
Patient scheduled on 10/14/19 @ 11:15pm with Carlynn Herald at [facility] [address], [place],  [address] [phone]  Left message for patient to call.

## 2019-07-30 NOTE — Telephone Encounter (Signed)
-----   Message from Princess Bruins, MD sent at 07/21/2019  2:32 PM EDT ----- Regarding: Refer to [facility]/Full skin exam.

## 2019-08-05 ENCOUNTER — Ambulatory Visit: Payer: 59 | Admitting: Rheumatology

## 2019-08-10 ENCOUNTER — Encounter: Payer: Self-pay | Admitting: Family Medicine

## 2019-08-10 ENCOUNTER — Encounter: Payer: Self-pay | Admitting: Rheumatology

## 2019-08-25 MED ORDER — FLUCONAZOLE 150 MG PO TABS: ORAL_TABLET | ORAL | 0 refills | Status: DC

## 2019-08-26 ENCOUNTER — Encounter: Payer: Self-pay | Admitting: *Deleted

## 2019-08-26 NOTE — Telephone Encounter (Signed)
Patient never called back sent my chart message

## 2019-12-18 ENCOUNTER — Encounter: Payer: Self-pay | Admitting: Family Medicine

## 2019-12-19 ENCOUNTER — Encounter: Payer: Self-pay | Admitting: Family Medicine

## 2020-03-29 ENCOUNTER — Other Ambulatory Visit: Payer: Self-pay | Admitting: Family Medicine

## 2020-03-29 DIAGNOSIS — E7800 Pure hypercholesterolemia, unspecified: Secondary | ICD-10-CM

## 2020-03-30 NOTE — Telephone Encounter (Signed)
Can ya'll plz sched a F/U per WK/thx dmf

## 2020-03-30 NOTE — Telephone Encounter (Signed)
Tried to call pt to schedule appt, lvm

## 2020-05-22 ENCOUNTER — Encounter: Payer: Self-pay | Admitting: Family Medicine

## 2020-05-22 ENCOUNTER — Ambulatory Visit (INDEPENDENT_AMBULATORY_CARE_PROVIDER_SITE_OTHER): Payer: PRIVATE HEALTH INSURANCE | Admitting: Family Medicine

## 2020-05-22 ENCOUNTER — Other Ambulatory Visit: Payer: Self-pay

## 2020-05-22 VITALS — BP 136/74 | HR 70 | Temp 98.0°F | Ht 64.0 in | Wt 158.4 lb

## 2020-05-22 DIAGNOSIS — J301 Allergic rhinitis due to pollen: Secondary | ICD-10-CM

## 2020-05-22 DIAGNOSIS — E7800 Pure hypercholesterolemia, unspecified: Secondary | ICD-10-CM

## 2020-05-22 DIAGNOSIS — Z0000 Encounter for general adult medical examination without abnormal findings: Secondary | ICD-10-CM

## 2020-05-22 DIAGNOSIS — L920 Granuloma annulare: Secondary | ICD-10-CM | POA: Diagnosis not present

## 2020-05-22 LAB — COMPREHENSIVE METABOLIC PANEL
ALT: 20 U/L (ref 0–35)
AST: 20 U/L (ref 0–37)
Albumin: 4.3 g/dL (ref 3.5–5.2)
Alkaline Phosphatase: 72 U/L (ref 39–117)
BUN: 19 mg/dL (ref 6–23)
CO2: 29 mEq/L (ref 19–32)
Calcium: 9.4 mg/dL (ref 8.4–10.5)
Chloride: 105 mEq/L (ref 96–112)
Creatinine, Ser: 0.73 mg/dL (ref 0.40–1.20)
GFR: 81.18 mL/min (ref >60.00)
Glucose, Bld: 91 mg/dL (ref 70–99)
Potassium: 4.1 mEq/L (ref 3.5–5.1)
Sodium: 141 mEq/L (ref 135–145)
Total Bilirubin: 0.3 mg/dL (ref 0.2–1.2)
Total Protein: 6.7 g/dL (ref 6.0–8.3)

## 2020-05-22 LAB — LDL CHOLESTEROL, DIRECT: Direct LDL: 93 mg/dL

## 2020-05-22 LAB — URINALYSIS, ROUTINE W REFLEX MICROSCOPIC
Bilirubin Urine: NEGATIVE
Ketones, ur: NEGATIVE
Leukocytes,Ua: NEGATIVE
Nitrite: NEGATIVE
Specific Gravity, Urine: <=1.005 — AB (ref 1.000–1.030)
Total Protein, Urine: NEGATIVE
Urine Glucose: NEGATIVE
Urobilinogen, UA: 0.2 (ref 0.0–1.0)
WBC, UA: NONE SEEN (ref 0–?)
pH: 6.5 (ref 5.0–8.0)

## 2020-05-22 LAB — LIPID PANEL
Cholesterol: 167 mg/dL (ref 0–200)
HDL: 44.8 mg/dL (ref >39.00)
LDL Cholesterol: 99 mg/dL (ref 0–99)
NonHDL: 122.04
Total CHOL/HDL Ratio: 4
Triglycerides: 113 mg/dL (ref 0.0–149.0)
VLDL: 22.6 mg/dL (ref 0.0–40.0)

## 2020-05-22 LAB — CBC
HCT: 39.9 % (ref 36.0–46.0)
Hemoglobin: 13.7 g/dL (ref 12.0–15.0)
MCHC: 34.3 g/dL (ref 30.0–36.0)
MCV: 90.1 fl (ref 78.0–100.0)
Platelets: 245 10*3/uL (ref 150.0–400.0)
RBC: 4.43 Mil/uL (ref 3.87–5.11)
RDW: 12.8 % (ref 11.5–15.5)
WBC: 5.8 10*3/uL (ref 4.0–10.5)

## 2020-05-22 NOTE — Patient Instructions (Addendum)
Health Maintenance for Postmenopausal Women Menopause is a normal process in which your ability to get pregnant comes to an end. This process happens slowly over many months or years, usually between the ages of 69 and 17. Menopause is complete when you have missed your menstrual periods for 12 months. It is important to talk with your health care provider about some of the most common conditions that affect women after menopause (postmenopausal women). These include heart disease, cancer, and bone loss (osteoporosis). Adopting a healthy lifestyle and getting preventive care can help to promote your health and wellness. The actions you take can also lower your chances of developing some of these common conditions. What should I know about menopause? During menopause, you may get a number of symptoms, such as:  Hot flashes. These can be moderate or severe.  Night sweats.  Decrease in sex drive.  Mood swings.  Headaches.  Tiredness.  Irritability.  Memory problems.  Insomnia. Choosing to treat or not to treat these symptoms is a decision that you make with your health care provider. Do I need hormone replacement therapy?  Hormone replacement therapy is effective in treating symptoms that are caused by menopause, such as hot flashes and night sweats.  Hormone replacement carries certain risks, especially as you become older. If you are thinking about using estrogen or estrogen with progestin, discuss the benefits and risks with your health care provider. What is my risk for heart disease and stroke? The risk of heart disease, heart attack, and stroke increases as you age. One of the causes may be a change in the body's hormones during menopause. This can affect how your body uses dietary fats, triglycerides, and cholesterol. Heart attack and stroke are medical emergencies. There are many things that you can do to help prevent heart disease and stroke. Watch your blood pressure  High  blood pressure causes heart disease and increases the risk of stroke. This is more likely to develop in people who have high blood pressure readings, are of African descent, or are overweight.  Have your blood pressure checked: ? Every 3-5 years if you are 31-48 years of age. ? Every year if you are 28 years old or older. Eat a healthy diet   Eat a diet that includes plenty of vegetables, fruits, low-fat dairy products, and lean protein.  Do not eat a lot of foods that are high in solid fats, added sugars, or sodium. Get regular exercise Get regular exercise. This is one of the most important things you can do for your health. Most adults should:  Try to exercise for at least 150 minutes each week. The exercise should increase your heart rate and make you sweat (moderate-intensity exercise).  Try to do strengthening exercises at least twice each week. Do these in addition to the moderate-intensity exercise.  Spend less time sitting. Even light physical activity can be beneficial. Other tips  Work with your health care provider to achieve or maintain a healthy weight.  Do not use any products that contain nicotine or tobacco, such as cigarettes, e-cigarettes, and chewing tobacco. If you need help quitting, ask your health care provider.  Know your numbers. Ask your health care provider to check your cholesterol and your blood sugar (glucose). Continue to have your blood tested as directed by your health care provider. Do I need screening for cancer? Depending on your health history and family history, you may need to have cancer screening at different stages of your life. This  may include screening for:  Breast cancer.  Cervical cancer.  Lung cancer.  Colorectal cancer. What is my risk for osteoporosis? After menopause, you may be at increased risk for osteoporosis. Osteoporosis is a condition in which bone destruction happens more quickly than new bone creation. To help prevent  osteoporosis or the bone fractures that can happen because of osteoporosis, you may take the following actions:  If you are 19-50 years old, get at least 1,000 mg of calcium and at least 600 mg of vitamin D per day.  If you are older than age 50 but younger than age 70, get at least 1,200 mg of calcium and at least 600 mg of vitamin D per day.  If you are older than age 70, get at least 1,200 mg of calcium and at least 800 mg of vitamin D per day. Smoking and drinking excessive alcohol increase the risk of osteoporosis. Eat foods that are rich in calcium and vitamin D, and do weight-bearing exercises several times each week as directed by your health care provider. How does menopause affect my mental health? Depression may occur at any age, but it is more common as you become older. Common symptoms of depression include:  Low or sad mood.  Changes in sleep patterns.  Changes in appetite or eating patterns.  Feeling an overall lack of motivation or enjoyment of activities that you previously enjoyed.  Frequent crying spells. Talk with your health care provider if you think that you are experiencing depression. General instructions See your health care provider for regular wellness exams and vaccines. This may include:  Scheduling regular health, dental, and eye exams.  Getting and maintaining your vaccines. These include: ? Influenza vaccine. Get this vaccine each year before the flu season begins. ? Pneumonia vaccine. ? Shingles vaccine. ? Tetanus, diphtheria, and pertussis (Tdap) booster vaccine. Your health care provider may also recommend other immunizations. Tell your health care provider if you have ever been abused or do not feel safe at home. Summary  Menopause is a normal process in which your ability to get pregnant comes to an end.  This condition causes hot flashes, night sweats, decreased interest in sex, mood swings, headaches, or lack of sleep.  Treatment for this  condition may include hormone replacement therapy.  Take actions to keep yourself healthy, including exercising regularly, eating a healthy diet, watching your weight, and checking your blood pressure and blood sugar levels.  Get screened for cancer and depression. Make sure that you are up to date with all your vaccines. This information is not intended to replace advice given to you by your health care provider. Make sure you discuss any questions you have with your health care provider. Document Revised: 11/04/2018 Document Reviewed: 11/04/2018 Elsevier Patient Education  2020 [employer]  Preventive Care 40-64 Years Old, Female Preventive care refers to visits with your health care provider and lifestyle choices that can promote health and wellness. This includes:  A yearly physical exam. This may also be called an annual well check.  Regular dental visits and eye exams.  Immunizations.  Screening for certain conditions.  Healthy lifestyle choices, such as eating a healthy diet, getting regular exercise, not using drugs or products that contain nicotine and tobacco, and limiting alcohol use. What can I expect for my preventive care visit? Physical exam Your health care provider will check your:  Height and weight. This may be used to calculate body mass index (BMI), which tells if you are   at a healthy weight.  Heart rate and blood pressure.  Skin for abnormal spots. Counseling Your health care provider may ask you questions about your:  Alcohol, tobacco, and drug use.  Emotional well-being.  Home and relationship well-being.  Sexual activity.  Eating habits.  Work and work environment.  Method of birth control.  Menstrual cycle.  Pregnancy history. What immunizations do I need?  Influenza (flu) vaccine  This is recommended every year. Tetanus, diphtheria, and pertussis (Tdap) vaccine  You may need a Td booster every 10 years. Varicella (chickenpox)  vaccine  You may need this if you have not been vaccinated. Zoster (shingles) vaccine  You may need this after age 60. Measles, mumps, and rubella (MMR) vaccine  You may need at least one dose of MMR if you were born in 1957 or later. You may also need a second dose. Pneumococcal conjugate (PCV13) vaccine  You may need this if you have certain conditions and were not previously vaccinated. Pneumococcal polysaccharide (PPSV23) vaccine  You may need one or two doses if you smoke cigarettes or if you have certain conditions. Meningococcal conjugate (MenACWY) vaccine  You may need this if you have certain conditions. Hepatitis A vaccine  You may need this if you have certain conditions or if you travel or work in places where you may be exposed to hepatitis A. Hepatitis B vaccine  You may need this if you have certain conditions or if you travel or work in places where you may be exposed to hepatitis B. Haemophilus influenzae type b (Hib) vaccine  You may need this if you have certain conditions. Human papillomavirus (HPV) vaccine  If recommended by your health care provider, you may need three doses over 6 months. You may receive vaccines as individual doses or as more than one vaccine together in one shot (combination vaccines). Talk with your health care provider about the risks and benefits of combination vaccines. What tests do I need? Blood tests  Lipid and cholesterol levels. These may be checked every 5 years, or more frequently if you are over 50 years old.  Hepatitis C test.  Hepatitis B test. Screening  Lung cancer screening. You may have this screening every year starting at age 55 if you have a 30-pack-year history of smoking and currently smoke or have quit within the past 15 years.  Colorectal cancer screening. All adults should have this screening starting at age 50 and continuing until age 75. Your health care provider may recommend screening at age 45 if you  are at increased risk. You will have tests every 1-10 years, depending on your results and the type of screening test.  Diabetes screening. This is done by checking your blood sugar (glucose) after you have not eaten for a while (fasting). You may have this done every 1-3 years.  Mammogram. This may be done every 1-2 years. Talk with your health care provider about when you should start having regular mammograms. This may depend on whether you have a family history of breast cancer.  BRCA-related cancer screening. This may be done if you have a family history of breast, ovarian, tubal, or peritoneal cancers.  Pelvic exam and Pap test. This may be done every 3 years starting at age 21. Starting at age 30, this may be done every 5 years if you have a Pap test in combination with an HPV test. Other tests  Sexually transmitted disease (STD) testing.  Bone density scan. This is done to screen   for osteoporosis. You may have this scan if you are at high risk for osteoporosis. Follow these instructions at home: Eating and drinking  Eat a diet that includes fresh fruits and vegetables, whole grains, lean protein, and low-fat dairy.  Take vitamin and mineral supplements as recommended by your health care provider.  Do not drink alcohol if: ? Your health care provider tells you not to drink. ? You are pregnant, may be pregnant, or are planning to become pregnant.  If you drink alcohol: ? Limit how much you have to 0-1 drink a day. ? Be aware of how much alcohol is in your drink. In the [place] one drink equals one 12 oz bottle of beer (355 mL), one 5 oz glass of wine (148 mL), or one 1 oz glass of hard liquor (44 mL). Lifestyle  Take daily care of your teeth and gums.  Stay active. Exercise for at least 30 minutes on 5 or more days each week.  Do not use any products that contain nicotine or tobacco, such as cigarettes, e-cigarettes, and chewing tobacco. If you need help quitting, ask your  health care provider.  If you are sexually active, practice safe sex. Use a condom or other form of birth control (contraception) in order to prevent pregnancy and STIs (sexually transmitted infections).  If told by your health care provider, take low-dose aspirin daily starting at age 68. What's next?  Visit your health care provider once a year for a well check visit.  Ask your health care provider how often you should have your eyes and teeth checked.  Stay up to date on all vaccines. This information is not intended to replace advice given to you by your health care provider. Make sure you discuss any questions you have with your health care provider. Document Revised: 07/23/2018 Document Reviewed: 07/23/2018 Elsevier Patient Education  2020 [employer].

## 2020-05-22 NOTE — Progress Notes (Addendum)
New Patient Office Visit  Subjective:  Patient ID: Tara Avery, female    DOB: 04-17-1961  Age: 59 y.o. MRN: [id number]  CC:  Chief Complaint  Patient presents with  . Annual Exam    CPE, patient would like recommendation for dermatolodist.     HPI Tara Avery presents for a physical exam follow-up of her elevated cholesterol and granuloma annulare.  Has been doing well with atorvastatin without issue.  Springtime allergies have been worse than usual this year.  Taking Zyrtec up to twice daily.  Flonase occasionally causes epistaxis.  Skin has been generally itchy and dry.  She is using gentle soaps and moisturizing creams.  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  She has been stressed due to her allergies.  Up-to-date on her pelvic exams.  She has had the Covid vaccine.  Past Medical History:  Diagnosis Date  . Allergy   . Arthritis   . Chicken pox   . Depression   . Emphysema of lung ([place])    when young  . Frequent headaches   . GERD (gastroesophageal reflux disease)   . Hyperlipidemia   . Migraine   . Urinary tract infection     Past Surgical History:  Procedure Laterality Date  . ABDOMINAL HYSTERECTOMY  2002   partial  . BUNIONECTOMY Left 06/1996    Family History  Problem Relation Age of Onset  . Alcohol abuse Mother   . Arthritis Mother   . Depression Mother   . Hyperlipidemia Mother   . Hypertension Mother   . Kidney disease Mother   . Alcohol abuse Father   . Early death Father   . Aneurysm Father   . Depression Sister   . Diabetes Sister   . Heart attack Sister   . Hyperlipidemia Sister   . Hypertension Sister   . Miscarriages / Stillbirths Sister   . Stroke Sister   . Healthy Daughter   . Heart attack Maternal Grandfather   . Hypertension Maternal Grandfather   . Hyperlipidemia Maternal Grandfather   . Arthritis Sister     Social History   Socioeconomic History  . Marital status: Married    Spouse name: Not on file    . Number of children: Not on file  . Years of education: Not on file  . Highest education level: Not on file  Occupational History  . Not on file  Tobacco Use  . Smoking status: Never Smoker  . Smokeless tobacco: Never Used  Vaping Use  . Vaping Use: Never used  Substance and Sexual Activity  . Alcohol use: No    Comment: rarely  . Drug use: No  . Sexual activity: Yes    Partners: Male    Comment: 1st intercourse- 15, partner- 1, married- 36 yrs   Other Topics Concern  . Not on file  Social History Narrative  . Not on file   Social Determinants of Health   Financial Resource Strain:   . Difficulty of Paying Living Expenses:   Food Insecurity:   . Worried About Charity fundraiser in the Last Year:   . Arboriculturist in the Last Year:   Transportation Needs:   . Film/video editor (Medical):   Marland Kitchen Lack of Transportation (Non-Medical):   Physical Activity:   . Days of Exercise per Week:   . Minutes of Exercise per Session:   Stress:   . Feeling of Stress :   Social Connections:   .  Frequency of Communication with Friends and Family:   . Frequency of Social Gatherings with Friends and Family:   . Attends Religious Services:   . Active Member of Clubs or Organizations:   . Attends Archivist Meetings:   Marland Kitchen Marital Status:   Intimate Partner Violence:   . Fear of Current or Ex-Partner:   . Emotionally Abused:   Marland Kitchen Physically Abused:   . Sexually Abused:     ROS Review of Systems  Constitutional: Negative.   HENT: Negative.   Eyes: Negative for photophobia and visual disturbance.  Respiratory: Negative.   Cardiovascular: Negative.   Gastrointestinal: Negative.   Endocrine: Negative for polyphagia and polyuria.  Genitourinary: Negative.   Musculoskeletal: Negative for gait problem and joint swelling.  Skin: Positive for rash. Negative for wound.  Allergic/Immunologic: Negative for immunocompromised state.  Neurological: Negative for tremors and  speech difficulty.  Hematological: Does not bruise/bleed easily.   Depression screen [facility] [place] 2/9 05/22/2020 05/20/2019  Decreased Interest 1 1  Down, Depressed, Hopeless 0 0  PHQ - 2 Score 1 1  Altered sleeping - 1  Tired, decreased energy - 1  Change in appetite - 1  Feeling bad or failure about yourself  - 0  Trouble concentrating - 1  Moving slowly or fidgety/restless - 0  Suicidal thoughts - 0  PHQ-9 Score - 5    Objective:   Today's Vitals: BP 136/74   Pulse 70   Temp 98 F (36.7 C) (Tympanic)   Ht 5\' 4"  (1.626 m)   Wt 158 lb 6.4 oz (71.8 kg)   SpO2 98%   BMI 27.19 kg/m   Physical Exam Vitals and nursing note reviewed.  Constitutional:      General: She is not in acute distress.    Appearance: Normal appearance. She is normal weight. She is not ill-appearing, toxic-appearing or diaphoretic.  HENT:     Head: Normocephalic and atraumatic.     Right Ear: Tympanic membrane, ear canal and external ear normal.     Left Ear: Tympanic membrane, ear canal and external ear normal.     Mouth/Throat:     Mouth: Mucous membranes are dry.     Pharynx: Oropharynx is clear. No oropharyngeal exudate or posterior oropharyngeal erythema.  Eyes:     General: No scleral icterus.       Right eye: No discharge.        Left eye: No discharge.  Neck:     Vascular: No carotid bruit.  Cardiovascular:     Rate and Rhythm: Normal rate and regular rhythm.  Pulmonary:     Effort: Pulmonary effort is normal.     Breath sounds: Normal breath sounds.  Abdominal:     General: Bowel sounds are normal.  Musculoskeletal:     Cervical back: Normal range of motion. No tenderness.  Lymphadenopathy:     Cervical: No cervical adenopathy.  Skin:      Neurological:     General: No focal deficit present.     Mental Status: She is alert and oriented to person, place, and time.  Psychiatric:        Mood and Affect: Mood normal.        Behavior: Behavior normal.   The 10-year ASCVD risk score Mikey Bussing  DC Jr., et al., 2013) is: 3.7%   Values used to calculate the score:     Age: 43 years     Sex: Female     Is Non-Hispanic African American:  No     Diabetic: No     Tobacco smoker: No     Systolic Blood Pressure: 191 mmHg     Is BP treated: No     HDL Cholesterol: 44.8 mg/dL     Total Cholesterol: 167 mg/dL  Assessment & Plan:   Problem List Items Addressed This Visit      Respiratory   Allergic rhinitis     Musculoskeletal and Integument   Granuloma annulare   Relevant Orders   Ambulatory referral to Dermatology     Other   Elevated LDL cholesterol level - Primary   Relevant Orders   Comprehensive metabolic panel   LDL cholesterol, direct   Lipid panel   Healthcare maintenance   Relevant Orders   CBC   Comprehensive metabolic panel   Urinalysis, Routine w reflex microscopic      Outpatient Encounter Medications as of 05/22/2020  Medication Sig  . atorvastatin (LIPITOR) 10 MG tablet TAKE 1 TABLET BY MOUTH EVERY DAY  . cetirizine (ZYRTEC) 10 MG tablet Take 10 mg by mouth daily.  . Cholecalciferol (VITAMIN D3) 2000 units TABS Take 1 tablet by mouth daily.  . diphenhydrAMINE (BENADRYL) 25 MG tablet Take 25 mg by mouth as needed.  . fluticasone (FLONASE) 50 MCG/ACT nasal spray Place 1 spray into both nostrils as needed for allergies or rhinitis.  Marland Kitchen ibuprofen (ADVIL,MOTRIN) 200 MG tablet Take 200 mg by mouth as needed.  . Multiple Vitamins-Minerals (CENTRUM WOMEN PO) Take 0.5 tablets by mouth daily.  . Simethicone (GAS-X PO) Take 1 tablet by mouth as needed.  Marland Kitchen estradiol (ESTRACE VAGINAL) 0.1 MG/GM vaginal cream Place 6.60 Applicatorfuls vaginally 2 (two) times a week. (Patient not taking: Reported on 05/22/2020)  . Omeprazole (PRILOSEC PO) Take by mouth as needed. (Patient not taking: Reported on 05/22/2020)  . [DISCONTINUED] fluconazole (DIFLUCAN) 150 MG tablet Take one tab po daily x 3 days. (Patient not taking: Reported on 05/22/2020)   No facility-administered  encounter medications on file as of 05/22/2020.    Follow-up: Return in about 1 year (around 05/22/2021), or if symptoms worsen or fail to improve.   Information given on health maintenance and disease prevention  Libby Maw, MD

## 2020-06-05 ENCOUNTER — Other Ambulatory Visit: Payer: Self-pay | Admitting: Family Medicine

## 2020-06-05 DIAGNOSIS — E7800 Pure hypercholesterolemia, unspecified: Secondary | ICD-10-CM

## 2020-07-06 ENCOUNTER — Ambulatory Visit: Payer: 59 | Admitting: Rheumatology

## 2020-07-21 ENCOUNTER — Encounter: Payer: 59 | Admitting: Obstetrics & Gynecology

## 2021-05-24 ENCOUNTER — Ambulatory Visit (INDEPENDENT_AMBULATORY_CARE_PROVIDER_SITE_OTHER): Payer: PRIVATE HEALTH INSURANCE | Admitting: Family Medicine

## 2021-05-24 ENCOUNTER — Other Ambulatory Visit: Payer: Self-pay

## 2021-05-24 ENCOUNTER — Encounter: Payer: Self-pay | Admitting: Family Medicine

## 2021-05-24 VITALS — BP 148/74 | HR 69 | Temp 97.5°F | Ht 65.0 in | Wt 156.6 lb

## 2021-05-24 DIAGNOSIS — D229 Melanocytic nevi, unspecified: Secondary | ICD-10-CM | POA: Insufficient documentation

## 2021-05-24 DIAGNOSIS — E7800 Pure hypercholesterolemia, unspecified: Secondary | ICD-10-CM | POA: Diagnosis not present

## 2021-05-24 DIAGNOSIS — Z0000 Encounter for general adult medical examination without abnormal findings: Secondary | ICD-10-CM | POA: Diagnosis not present

## 2021-05-24 DIAGNOSIS — L920 Granuloma annulare: Secondary | ICD-10-CM

## 2021-05-24 DIAGNOSIS — R030 Elevated blood-pressure reading, without diagnosis of hypertension: Secondary | ICD-10-CM | POA: Diagnosis not present

## 2021-05-24 DIAGNOSIS — F439 Reaction to severe stress, unspecified: Secondary | ICD-10-CM | POA: Diagnosis not present

## 2021-05-24 LAB — COMPREHENSIVE METABOLIC PANEL
ALT: 22 U/L (ref 0–35)
AST: 22 U/L (ref 0–37)
Albumin: 4.3 g/dL (ref 3.5–5.2)
Alkaline Phosphatase: 74 U/L (ref 39–117)
BUN: 12 mg/dL (ref 6–23)
CO2: 30 mEq/L (ref 19–32)
Calcium: 9.3 mg/dL (ref 8.4–10.5)
Chloride: 103 mEq/L (ref 96–112)
Creatinine, Ser: 0.72 mg/dL (ref 0.40–1.20)
GFR: 90.17 mL/min (ref >60.00)
Glucose, Bld: 82 mg/dL (ref 70–99)
Potassium: 4.2 mEq/L (ref 3.5–5.1)
Sodium: 139 mEq/L (ref 135–145)
Total Bilirubin: 0.5 mg/dL (ref 0.2–1.2)
Total Protein: 6.7 g/dL (ref 6.0–8.3)

## 2021-05-24 LAB — URINALYSIS, ROUTINE W REFLEX MICROSCOPIC
Bilirubin Urine: NEGATIVE
Ketones, ur: NEGATIVE
Leukocytes,Ua: NEGATIVE
Nitrite: NEGATIVE
Specific Gravity, Urine: <=1.005 — AB (ref 1.000–1.030)
Total Protein, Urine: NEGATIVE
Urine Glucose: NEGATIVE
Urobilinogen, UA: 0.2 (ref 0.0–1.0)
WBC, UA: NONE SEEN (ref 0–?)
pH: 6.5 (ref 5.0–8.0)

## 2021-05-24 LAB — CBC
HCT: 39.1 % (ref 36.0–46.0)
Hemoglobin: 13.6 g/dL (ref 12.0–15.0)
MCHC: 34.7 g/dL (ref 30.0–36.0)
MCV: 88.6 fl (ref 78.0–100.0)
Platelets: 228 10*3/uL (ref 150.0–400.0)
RBC: 4.42 Mil/uL (ref 3.87–5.11)
RDW: 12.5 % (ref 11.5–15.5)
WBC: 5.4 10*3/uL (ref 4.0–10.5)

## 2021-05-24 LAB — LIPID PANEL
Cholesterol: 142 mg/dL (ref 0–200)
HDL: 48.5 mg/dL (ref >39.00)
LDL Cholesterol: 68 mg/dL (ref 0–99)
NonHDL: 93.06
Total CHOL/HDL Ratio: 3
Triglycerides: 123 mg/dL (ref 0.0–149.0)
VLDL: 24.6 mg/dL (ref 0.0–40.0)

## 2021-05-24 NOTE — Progress Notes (Signed)
Established Patient Office Visit  Subjective:  Patient ID: Tara Avery, female    DOB: February 23, 1961  Age: 60 y.o. MRN: [id number]  CC:  Chief Complaint  Patient presents with   Annual Exam    CPE, allergies bothering her sharp pains in ears that come and go. Patient fasting for labs.     HPI Tara Avery presents for follow-up of elevated cholesterol on a yearly health check.  Patient admits to being stressed with all that is going on in the world today.  She has no history of high blood pressure.  She has been trying to exercise some.  Allergies seem to be worse this year.  Ears are congestion with chronic postnasal drip.  Flonase has been difficult to take because of epistaxis.  She developed left TMJ inflammation and is working with her dentist on this matter.  Continues seeing rheumatology.  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  Concerned about some new moles.  Status post fourth COVID vaccination.  Due for mammogram and bone density amatory testing.  Past Medical History:  Diagnosis Date   Allergy    Arthritis    Chicken pox    Depression    Emphysema of lung ([facility])    when young   Frequent headaches    GERD (gastroesophageal reflux disease)    Hyperlipidemia    Migraine    Urinary tract infection     Past Surgical History:  Procedure Laterality Date   ABDOMINAL HYSTERECTOMY  2002   partial   BUNIONECTOMY Left 06/1996    Family History  Problem Relation Age of Onset   Alcohol abuse Mother    Arthritis Mother    Depression Mother    Hyperlipidemia Mother    Hypertension Mother    Kidney disease Mother    Alcohol abuse Father    Early death Father    Aneurysm Father    Depression Sister    Diabetes Sister    Heart attack Sister    Hyperlipidemia Sister    Hypertension Sister    34 / [place] Sister    Stroke Sister    Healthy Daughter    Heart attack Maternal Grandfather    Hypertension Maternal Grandfather    Hyperlipidemia  Maternal Grandfather    Arthritis Sister     Social History   Socioeconomic History   Marital status: Married    Spouse name: Not on file   Number of children: Not on file   Years of education: Not on file   Highest education level: Not on file  Occupational History   Not on file  Tobacco Use   Smoking status: Never   Smokeless tobacco: Never  Vaping Use   Vaping Use: Never used  Substance and Sexual Activity   Alcohol use: No    Comment: rarely   Drug use: No   Sexual activity: Yes    Partners: Male    Comment: 1st intercourse- 35, partner- 64, married- 65 yrs   Other Topics Concern   Not on file  Social History Narrative   Not on file   Social Determinants of Health   Financial Resource Strain: Not on file  Food Insecurity: Not on file  Transportation Needs: Not on file  Physical Activity: Not on file  Stress: Not on file  Social Connections: Not on file  Intimate Partner Violence: Not on file    Outpatient Medications Prior to Visit  Medication Sig Dispense Refill   atorvastatin (LIPITOR) 10 MG  tablet TAKE 1 TABLET BY MOUTH EVERY DAY 90 tablet 3   cetirizine (ZYRTEC) 10 MG tablet Take 10 mg by mouth daily.     Cholecalciferol (VITAMIN D3) 2000 units TABS Take 1 tablet by mouth daily.     diphenhydrAMINE (BENADRYL) 25 MG tablet Take 25 mg by mouth as needed.     fluticasone (FLONASE) 50 MCG/ACT nasal spray Place 1 spray into both nostrils as needed for allergies or rhinitis.     ibuprofen (ADVIL,MOTRIN) 200 MG tablet Take 200 mg by mouth as needed.     Multiple Vitamins-Minerals (CENTRUM WOMEN PO) Take 0.5 tablets by mouth daily.     Simethicone (GAS-X PO) Take 1 tablet by mouth as needed.     estradiol (ESTRACE VAGINAL) 0.1 MG/GM vaginal cream Place 2.75 Applicatorfuls vaginally 2 (two) times a week. (Patient not taking: Reported on 05/22/2020) 42.5 g 4   Omeprazole (PRILOSEC PO) Take by mouth as needed. (Patient not taking: No sig reported)     No  facility-administered medications prior to visit.    Allergies  Allergen Reactions   Prednisone Palpitations    Patient more recently reported sob while taking it. I consider this to be more of a significant reaction.    Codeine    Latex     ROS Review of Systems  Constitutional: Negative.   HENT:  Positive for congestion, postnasal drip and rhinorrhea. Negative for ear discharge and ear pain.   Eyes:  Negative for photophobia and visual disturbance.  Respiratory: Negative.    Cardiovascular: Negative.   Gastrointestinal: Negative.   Endocrine: Negative for polyphagia and polyuria.  Genitourinary: Negative.   Musculoskeletal:  Positive for arthralgias.  Skin:  Positive for rash.  Neurological:  Negative for speech difficulty, weakness and light-headedness.     Objective:    Physical Exam Vitals and nursing note reviewed.  Constitutional:      General: She is not in acute distress.    Appearance: Normal appearance. She is not ill-appearing, toxic-appearing or diaphoretic.  HENT:     Head: Normocephalic and atraumatic.     Right Ear: Tympanic membrane, ear canal and external ear normal.     Left Ear: Tympanic membrane, ear canal and external ear normal.     Mouth/Throat:     Mouth: Mucous membranes are moist.     Pharynx: Oropharynx is clear. No oropharyngeal exudate or posterior oropharyngeal erythema.  Eyes:     General: No scleral icterus.       Right eye: No discharge.        Left eye: No discharge.     Extraocular Movements: Extraocular movements intact.     Conjunctiva/sclera: Conjunctivae normal.     Pupils: Pupils are equal, round, and reactive to light.  Neck:     Vascular: No carotid bruit.  Cardiovascular:     Rate and Rhythm: Normal rate and regular rhythm.     Pulses:          Dorsalis pedis pulses are 2+ on the right side and 2+ on the left side.       Posterior tibial pulses are 2+ on the right side and 2+ on the left side.  Pulmonary:     Effort:  Pulmonary effort is normal.     Breath sounds: Normal breath sounds.  Abdominal:     General: Abdomen is flat. Bowel sounds are normal.     Palpations: Abdomen is soft.  Musculoskeletal:     Cervical back: Normal range  of motion and neck supple. No rigidity or tenderness.     Right lower leg: No edema.     Left lower leg: No edema.     Comments: Feet are cavus bilaterally.  Advised patient to wear shoes with good arch support.  Lymphadenopathy:     Cervical: No cervical adenopathy.  Skin:    General: Skin is warm and dry.  Neurological:     General: No focal deficit present.     Mental Status: She is alert and oriented to person, place, and time.  Psychiatric:        Mood and Affect: Mood normal.        Behavior: Behavior normal.    BP (!) 148/74   Pulse 69   Temp (!) 97.5 F (36.4 C) (Temporal)   Ht 5\' 5"  (1.651 m)   Wt 156 lb 9.6 oz (71 kg)   SpO2 99%   BMI 26.06 kg/m  Wt Readings from Last 3 Encounters:  05/24/21 156 lb 9.6 oz (71 kg)  05/22/20 158 lb 6.4 oz (71.8 kg)  07/21/19 156 lb (70.8 kg)     Health Maintenance Due  Topic Date Due   Pneumococcal Vaccine 45-54 Years old (1 - PCV) Never done   TETANUS/TDAP  Never done   Zoster Vaccines- Shingrix (1 of 2) Never done   MAMMOGRAM  07/23/2020   COLONOSCOPY (Pts 45-38yrs Insurance coverage will need to be confirmed)  04/25/2021    There are no preventive care reminders to display for this patient.  Lab Results  Component Value Date   TSH 1.22 01/07/2018   Lab Results  Component Value Date   WBC 5.8 05/22/2020   HGB 13.7 05/22/2020   HCT 39.9 05/22/2020   MCV 90.1 05/22/2020   PLT 245.0 05/22/2020   Lab Results  Component Value Date   NA 141 05/22/2020   K 4.1 05/22/2020   CO2 29 05/22/2020   GLUCOSE 91 05/22/2020   BUN 19 05/22/2020   CREATININE 0.73 05/22/2020   BILITOT 0.3 05/22/2020   ALKPHOS 72 05/22/2020   AST 20 05/22/2020   ALT 20 05/22/2020   PROT 6.7 05/22/2020   ALBUMIN 4.3  05/22/2020   CALCIUM 9.4 05/22/2020   GFR 81.18 05/22/2020   Lab Results  Component Value Date   CHOL 167 05/22/2020   Lab Results  Component Value Date   HDL 44.80 05/22/2020   Lab Results  Component Value Date   LDLCALC 99 05/22/2020   Lab Results  Component Value Date   TRIG 113.0 05/22/2020   Lab Results  Component Value Date   CHOLHDL 4 05/22/2020   No results found for: HGBA1C    Assessment & Plan:   Problem List Items Addressed This Visit       Musculoskeletal and Integument   Granuloma annulare   Relevant Orders   Ambulatory referral to Dermatology   Atypical nevi   Relevant Orders   Ambulatory referral to Dermatology     Other   Elevated LDL cholesterol level - Primary   Relevant Orders   Comprehensive metabolic panel   Lipid panel   Healthcare maintenance   Relevant Orders   MM Digital Screening   DG Bone Density   Stress   Elevated BP without diagnosis of hypertension   Relevant Orders   CBC   Comprehensive metabolic panel   Urinalysis, Routine w reflex microscopic    No orders of the defined types were placed in this encounter.  Follow-up: Return in about 3 months (around 08/24/2021).  Will obtain a blood pressure cuff checking record blood pressures follow-up in 3 months.  Given information on preventing high blood pressure.  Given information on health maintenance and disease prevention.  Also given information on mindfulness based stress reduction.  I recommended a bone density amatory test.  Recommended the Shingrix series.  She was given information on this as well.  Discussed restarting Flonase and perhaps using it every other day or every third day.  Continue using nasal saline.  Let me know if this does not work.  Continue follow-up with rheumatology.  Libby Maw, MD

## 2021-06-09 ENCOUNTER — Other Ambulatory Visit: Payer: Self-pay | Admitting: Family Medicine

## 2021-06-09 DIAGNOSIS — E7800 Pure hypercholesterolemia, unspecified: Secondary | ICD-10-CM

## 2021-06-11 ENCOUNTER — Encounter: Payer: Self-pay | Admitting: Family Medicine

## 2021-06-11 NOTE — Telephone Encounter (Signed)
Pt called in about this as well but I dont see anything about them calling before. Please advise

## 2021-06-12 NOTE — Telephone Encounter (Signed)
Tara Avery from [employer]. They rec'd the form for the [facility] order but Dr Ethelene Hal had not signed it. She is asking it be refaxed with his signature And please include ref# [id number]  Thank you

## 2021-06-22 NOTE — Progress Notes (Signed)
Office Visit Note  Patient: Tara Avery             Date of Birth: 1961/04/27           MRN: [id number]             PCP: Libby Maw, MD Referring: Libby Maw,* Visit Date: 07/06/2021 Occupation: '@GUAROCC'$ @  Subjective:  Pain in multiple joints.   History of Present Illness: Tara Avery is a 60 y.o. female with a history of osteoarthritis and psoriasis.  She returns today after her last visit in August 2020.  She states over the last 2 years she has noticed increased pain and discomfort in her hands.  She describes pain mostly in her DIP joints.  Has noticed some swelling in her right hand.  She also has ongoing discomfort in her knee joints.  She uses insoles inside her shoes.  She complains of SI joint discomfort and lower back pain.    She was also diagnosed with left temporal mandibular joint dysfunction.  She states that she has a new dog and she has been walking more than usual.  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  She has not seen a dermatologist in a long time.  Activities of Daily Living:  Patient reports morning stiffness for 20-30 minutes.   Patient Reports nocturnal pain.  Difficulty dressing/grooming: Reports Difficulty climbing stairs: Reports Difficulty getting out of chair: Reports Difficulty using hands for taps, buttons, cutlery, and/or writing: Reports  Review of Systems  Constitutional:  Positive for fatigue.  HENT:  Positive for mouth dryness. Negative for mouth sores and nose dryness.   Eyes:  Positive for dryness. Negative for pain and itching.  Respiratory:  Negative for shortness of breath and difficulty breathing.   Cardiovascular:  Negative for chest pain and palpitations.  Gastrointestinal:  Positive for diarrhea. Negative for blood in stool and constipation.  Endocrine: Negative for increased urination.  Genitourinary:  Negative for difficulty  urinating.  Musculoskeletal:  Positive for joint pain, joint pain, joint swelling, myalgias, morning stiffness, muscle tenderness and myalgias.  Skin:  Negative for color change, rash and redness.  Allergic/Immunologic: Negative for susceptible to infections.  Neurological:  Positive for numbness, headaches and parasthesias. Negative for dizziness and memory loss.  Hematological:  Positive for bruising/bleeding tendency.  Psychiatric/Behavioral:  Negative for confusion.    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   History of psoriasis 05/20/2019   History of sarcoidosis 05/20/2019   Allergic rhinitis 05/20/2019   Sciatica of left side 05/20/2019   Arthralgia 05/20/2019   Elevated LDL cholesterol level 01/07/2018   Healthcare maintenance 01/07/2018    Past Medical History:  Diagnosis Date   Allergy    Arthritis    Chicken pox    Depression    Emphysema of lung (HCC)    when young   Frequent headaches    GERD (gastroesophageal reflux disease)    Hyperlipidemia    Migraine    Urinary tract infection     Family History  Problem Relation Age of Onset   Alcohol abuse Mother    Arthritis Mother    Depression Mother    Hyperlipidemia Mother    Hypertension Mother    Kidney disease Mother  Alcohol abuse Father    Early death Father    Aneurysm Father    Depression Sister    Diabetes Sister    Heart attack Sister    Hyperlipidemia Sister    Hypertension Sister    71 / [place] Sister    Stroke Sister    Healthy Daughter    Heart attack Maternal Grandfather    Hypertension Maternal Grandfather    Hyperlipidemia Maternal Grandfather    Arthritis Sister    Past Surgical History:  Procedure Laterality Date   ABDOMINAL HYSTERECTOMY  2002   partial   BUNIONECTOMY Left 06/1996   Social  History   Social History Narrative   Not on file   Immunization History  Administered Date(s) Administered   PFIZER Comirnaty(Gray Top)Covid-19 Tri-Sucrose Vaccine 09/03/2020, 03/30/2021   PFIZER(Purple Top)SARS-COV-2 Vaccination 02/04/2020, 02/28/2020     Objective: Vital Signs: BP (!) 166/96 (BP Location: Left Arm, Patient Position: Sitting, Cuff Size: Normal)   Pulse 73   Ht 5' 5.5" (1.664 m)   Wt 159 lb 9.6 oz (72.4 kg)   BMI 26.15 kg/m    Physical Exam Vitals and nursing note reviewed.  Constitutional:      Appearance: She is well-developed.  HENT:     Head: Normocephalic and atraumatic.  Eyes:     Conjunctiva/sclera: Conjunctivae normal.  Cardiovascular:     Rate and Rhythm: Normal rate and regular rhythm.     Heart sounds: Normal heart sounds.  Pulmonary:     Effort: Pulmonary effort is normal.     Breath sounds: Normal breath sounds.  Abdominal:     General: Bowel sounds are normal.     Palpations: Abdomen is soft.  Musculoskeletal:     Cervical back: Normal range of motion.  Lymphadenopathy:     Cervical: No cervical adenopathy.  Skin:    General: Skin is warm and dry.     Capillary Refill: Capillary refill takes less than 2 seconds.  Neurological:     Mental Status: She is alert and oriented to person, place, and time.  Psychiatric:        Behavior: Behavior normal.     Musculoskeletal Exam: C-spine thoracic and lumbar spine were in good range of motion.  She had tenderness on palpation of left SI joint.  Shoulder joints, elbow joints, wrist joints, MCPs PIPs and DIPs with good range of motion.  She had bilateral PIP and DIP thickening.  She also has some tenderness over left wrist joint and right second MCP joint.  No synovitis was noted.  Hip joints with good range of motion.  She has crepitus in her knee joints without any warmth swelling or effusion.  She had no tenderness or ankles or MTPs.  CDAI Exam: CDAI Score: -- Patient Global: --; Provider  Global: -- Swollen: --; Tender: -- Joint Exam 07/06/2021   No joint exam has been documented for this visit   There is currently no information documented on the homunculus. Go to the Rheumatology activity and complete the homunculus joint exam.  Investigation: No additional findings.  Imaging: No results found.  Recent Labs: Lab Results  Component Value Date   WBC 5.4 05/24/2021   HGB 13.6 05/24/2021   PLT 228.0 05/24/2021   NA 139 05/24/2021   K 4.2 05/24/2021   CL 103 05/24/2021   CO2 30 05/24/2021   GLUCOSE 82 05/24/2021   BUN 12 05/24/2021   CREATININE 0.72 05/24/2021   BILITOT 0.5 05/24/2021   ALKPHOS 74  05/24/2021   AST 22 05/24/2021   ALT 22 05/24/2021   PROT 6.7 05/24/2021   ALBUMIN 4.3 05/24/2021   CALCIUM 9.3 05/24/2021    Speciality Comments: No specialty comments available.  Procedures:  No procedures performed Allergies: Prednisone, Codeine, and Latex   Assessment / Plan:     Visit Diagnoses: Pain in both hands -patient has known history of osteoarthritis.  She also has psoriasis.  She states she has been having increased pain and discomfort in her hands with intermittent swelling.  She describes discomfort in the left wrist, right second MCP joint and her DIP joints.  No synovitis was noted.  Bilateral DIP thickening with no synovitis was noted.  No nail pitting was noted.  Plan: XR Hand 2 View Right, XR Hand 2 View Left.  X-ray findings were discussed with the patient.  X-rays were consistent with osteoarthritis.  No radiographic progression was noted.  A handout on hand exercises was given.  She declined physical therapy.  Primary osteoarthritis of both hands -joint protection muscle strengthening was discussed.  A handout on hand exercises was given.  Chondromalacia of both patellae-she has intermittent discomfort in her knee joints.  Lower extremity muscle strengthening exercises were discussed.  Primary osteoarthritis of both feet-she has  off-and-on discomfort in her feet.  She states using the orthotics has been helpful.  Chronic SI joint pain - XR unremarkable -she has a new dog.  Has been walking her dog.  She states she has been having discomfort in the left SI joint.  Plan: XR Pelvis 1-2 Views.  X-ray showed some osteoarthritic changes in the SI joints.  X-ray findings were discussed with the patient.  No radiographic progression was noted.  DDD (degenerative disc disease), lumbar - x-ray showed mild dextroscoliosis and significant disc disease.  She is off-and-on discomfort in the lower back.  Lumbar spine exercises were given.  I offered physical therapy which she declined.  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  History of sarcoidosis-she has had no flares.  Psoriasis-she continues to have psoriasis patches.  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  Elevated LDL cholesterol level  Thoracic outlet syndrome  Orders: Orders Placed This Encounter  Procedures   XR Hand 2 View Right   XR Hand 2 View Left   XR Pelvis 1-2 Views    No orders of the defined types were placed in this encounter.    Follow-Up Instructions: Return for Osteoarthritis.   Bo Merino, MD  Note - This record has been created using Editor, commissioning.  Chart creation errors have been sought, but may not always  have been located. Such creation errors do not reflect on  the standard of medical care.

## 2021-06-25 LAB — COLOGUARD: Cologuard: NEGATIVE

## 2021-06-30 LAB — COLOGUARD: COLOGUARD: NEGATIVE

## 2021-07-06 ENCOUNTER — Ambulatory Visit: Payer: Self-pay

## 2021-07-06 ENCOUNTER — Encounter: Payer: Self-pay | Admitting: Rheumatology

## 2021-07-06 ENCOUNTER — Other Ambulatory Visit: Payer: Self-pay

## 2021-07-06 ENCOUNTER — Ambulatory Visit: Payer: PRIVATE HEALTH INSURANCE | Admitting: Rheumatology

## 2021-07-06 VITALS — BP 166/96 | HR 73 | Ht 65.5 in | Wt 159.6 lb

## 2021-07-06 DIAGNOSIS — M2241 Chondromalacia patellae, right knee: Secondary | ICD-10-CM

## 2021-07-06 DIAGNOSIS — G540 Brachial plexus disorders: Secondary | ICD-10-CM

## 2021-07-06 DIAGNOSIS — M19071 Primary osteoarthritis, right ankle and foot: Secondary | ICD-10-CM

## 2021-07-06 DIAGNOSIS — L409 Psoriasis, unspecified: Secondary | ICD-10-CM

## 2021-07-06 DIAGNOSIS — M5136 Other intervertebral disc degeneration, lumbar region: Secondary | ICD-10-CM

## 2021-07-06 DIAGNOSIS — M2242 Chondromalacia patellae, left knee: Secondary | ICD-10-CM

## 2021-07-06 DIAGNOSIS — E7800 Pure hypercholesterolemia, unspecified: Secondary | ICD-10-CM

## 2021-07-06 DIAGNOSIS — M19072 Primary osteoarthritis, left ankle and foot: Secondary | ICD-10-CM

## 2021-07-06 DIAGNOSIS — L920 Granuloma annulare: Secondary | ICD-10-CM

## 2021-07-06 DIAGNOSIS — M79642 Pain in left hand: Secondary | ICD-10-CM

## 2021-07-06 DIAGNOSIS — M79641 Pain in right hand: Secondary | ICD-10-CM | POA: Diagnosis not present

## 2021-07-06 DIAGNOSIS — M19042 Primary osteoarthritis, left hand: Secondary | ICD-10-CM

## 2021-07-06 DIAGNOSIS — M533 Sacrococcygeal disorders, not elsewhere classified: Secondary | ICD-10-CM

## 2021-07-06 DIAGNOSIS — M19041 Primary osteoarthritis, right hand: Secondary | ICD-10-CM | POA: Diagnosis not present

## 2021-07-06 DIAGNOSIS — M26609 Unspecified temporomandibular joint disorder, unspecified side: Secondary | ICD-10-CM

## 2021-07-06 DIAGNOSIS — G8929 Other chronic pain: Secondary | ICD-10-CM

## 2021-07-06 DIAGNOSIS — M51369 Other intervertebral disc degeneration, lumbar region without mention of lumbar back pain or lower extremity pain: Secondary | ICD-10-CM

## 2021-07-06 DIAGNOSIS — Z862 Personal history of diseases of the blood and blood-forming organs and certain disorders involving the immune mechanism: Secondary | ICD-10-CM

## 2021-07-06 NOTE — Patient Instructions (Signed)
Hand Exercises Hand exercises can be helpful for almost anyone. These exercises can strengthen the hands, improve flexibility and movement, and increase blood flow to the hands. These results can make work and daily tasks easier. Hand exercises can be especially helpful for people who have joint pain from arthritis or have nerve damage from overuse (carpal tunnel syndrome). These exercises can also help people who have injured a hand. Exercises Most of these hand exercises are gentle stretching and motion exercises. It is usually safe to do them often throughout the day. Warming up your hands before exercise may help to reduce stiffness. You can do this with gentle massage orby placing your hands in warm water for 10-15 minutes. It is normal to feel some stretching, pulling, tightness, or mild discomfort as you begin new exercises. This will gradually improve. Stop an exercise right away if you feel sudden, severe pain or your pain gets worse. Ask your healthcare provider which exercises are best for you. Knuckle bend or "claw" fist Stand or sit with your arm, hand, and all five fingers pointed straight up. Make sure to keep your wrist straight during the exercise. Gently bend your fingers down toward your palm until the tips of your fingers are touching the top of your palm. Keep your big knuckle straight and just bend the small knuckles in your fingers. Hold this position for __________ seconds. Straighten (extend) your fingers back to the starting position. Repeat this exercise 5-10 times with each hand. Full finger fist Stand or sit with your arm, hand, and all five fingers pointed straight up. Make sure to keep your wrist straight during the exercise. Gently bend your fingers into your palm until the tips of your fingers are touching the middle of your palm. Hold this position for __________ seconds. Extend your fingers back to the starting position, stretching every joint fully. Repeat this  exercise 5-10 times with each hand. Straight fist Stand or sit with your arm, hand, and all five fingers pointed straight up. Make sure to keep your wrist straight during the exercise. Gently bend your fingers at the big knuckle, where your fingers meet your hand, and the middle knuckle. Keep the knuckle at the tips of your fingers straight and try to touch the bottom of your palm. Hold this position for __________ seconds. Extend your fingers back to the starting position, stretching every joint fully. Repeat this exercise 5-10 times with each hand. Tabletop Stand or sit with your arm, hand, and all five fingers pointed straight up. Make sure to keep your wrist straight during the exercise. Gently bend your fingers at the big knuckle, where your fingers meet your hand, as far down as you can while keeping the small knuckles in your fingers straight. Think of forming a tabletop with your fingers. Hold this position for __________ seconds. Extend your fingers back to the starting position, stretching every joint fully. Repeat this exercise 5-10 times with each hand. Finger spread Place your hand flat on a table with your palm facing down. Make sure your wrist stays straight as you do this exercise. Spread your fingers and thumb apart from each other as far as you can until you feel a gentle stretch. Hold this position for __________ seconds. Bring your fingers and thumb tight together again. Hold this position for __________ seconds. Repeat this exercise 5-10 times with each hand. Making circles Stand or sit with your arm, hand, and all five fingers pointed straight up. Make sure to keep your   wrist straight during the exercise. Make a circle by touching the tip of your thumb to the tip of your index finger. Hold for __________ seconds. Then open your hand wide. Repeat this motion with your thumb and each finger on your hand. Repeat this exercise 5-10 times with each hand. Thumb motion Sit  with your forearm resting on a table and your wrist straight. Your thumb should be facing up toward the ceiling. Keep your fingers relaxed as you move your thumb. Lift your thumb up as high as you can toward the ceiling. Hold for __________ seconds. Bend your thumb across your palm as far as you can, reaching the tip of your thumb for the small finger (pinkie) side of your palm. Hold for __________ seconds. Repeat this exercise 5-10 times with each hand. Grip strengthening  Hold a stress ball or other soft ball in the middle of your hand. Slowly increase the pressure, squeezing the ball as much as you can without causing pain. Think of bringing the tips of your fingers into the middle of your palm. All of your finger joints should bend when doing this exercise. Hold your squeeze for __________ seconds, then relax. Repeat this exercise 5-10 times with each hand. Contact a health care provider if: Your hand pain or discomfort gets much worse when you do an exercise. Your hand pain or discomfort does not improve within 2 hours after you exercise. If you have any of these problems, stop doing these exercises right away. Do not do them again unless your health care provider says that you can. Get help right away if: You develop sudden, severe hand pain or swelling. If this happens, stop doing these exercises right away. Do not do them again unless your health care provider says that you can. This information is not intended to replace advice given to you by your health care provider. Make sure you discuss any questions you have with your healthcare provider. Document Revised: 03/04/2019 Document Reviewed: 11/12/2018 Elsevier Patient Education  [place]. Back Exercises The following exercises strengthen the muscles that help to support the trunk and back. They also help to keep the lower back flexible. Doing these exercises can help to prevent back pain or lessen existing pain. If you have  back pain or discomfort, try doing these exercises 2-3 times each day or as told by your health care provider. As your pain improves, do them once each day, but increase the number of times that you repeat the steps for each exercise (do more repetitions). To prevent the recurrence of back pain, continue to do these exercises once each day or as told by your health care provider. Do exercises exactly as told by your health care provider and adjust them as directed. It is normal to feel mild stretching, pulling, tightness, or discomfort as you do these exercises, but you should stop right away if youfeel sudden pain or your pain gets worse. Exercises Single knee to chest Repeat these steps 3-5 times for each leg: Lie on your back on a firm bed or the floor with your legs extended. Bring one knee to your chest. Your other leg should stay extended and in contact with the floor. Hold your knee in place by grabbing your knee or thigh with both hands and hold. Pull on your knee until you feel a gentle stretch in your lower back or buttocks. Hold the stretch for 10-30 seconds. Slowly release and straighten your leg. Pelvic tilt Repeat these steps  5-10 times: Lie on your back on a firm bed or the floor with your legs extended. Bend your knees so they are pointing toward the ceiling and your feet are flat on the floor. Tighten your lower abdominal muscles to press your lower back against the floor. This motion will tilt your pelvis so your tailbone points up toward the ceiling instead of pointing to your feet or the floor. With gentle tension and even breathing, hold this position for 5-10 seconds. Cat-cow Repeat these steps until your lower back becomes more flexible: Get into a hands-and-knees position on a firm surface. Keep your hands under your shoulders, and keep your knees under your hips. You may place padding under your knees for comfort. Let your head hang down toward your chest. Contract your  abdominal muscles and point your tailbone toward the floor so your lower back becomes rounded like the back of a cat. Hold this position for 5 seconds. Slowly lift your head, let your abdominal muscles relax and point your tailbone up toward the ceiling so your back forms a sagging arch like the back of a cow. Hold this position for 5 seconds.  Press-ups Repeat these steps 5-10 times: Lie on your abdomen (face-down) on the floor. Place your palms near your head, about shoulder-width apart. Keeping your back as relaxed as possible and keeping your hips on the floor, slowly straighten your arms to raise the top half of your body and lift your shoulders. Do not use your back muscles to raise your upper torso. You may adjust the placement of your hands to make yourself more comfortable. Hold this position for 5 seconds while you keep your back relaxed. Slowly return to lying flat on the floor.  Bridges Repeat these steps 10 times: Lie on your back on a firm surface. Bend your knees so they are pointing toward the ceiling and your feet are flat on the floor. Your arms should be flat at your sides, next to your body. Tighten your buttocks muscles and lift your buttocks off the floor until your waist is at almost the same height as your knees. You should feel the muscles working in your buttocks and the back of your thighs. If you do not feel these muscles, slide your feet 1-2 inches farther away from your buttocks. Hold this position for 3-5 seconds. Slowly lower your hips to the starting position, and allow your buttocks muscles to relax completely. If this exercise is too easy, try doing it with your arms crossed over yourchest. Abdominal crunches Repeat these steps 5-10 times: Lie on your back on a firm bed or the floor with your legs extended. Bend your knees so they are pointing toward the ceiling and your feet are flat on the floor. Cross your arms over your chest. Tip your chin slightly  toward your chest without bending your neck. Tighten your abdominal muscles and slowly raise your trunk (torso) high enough to lift your shoulder blades a tiny bit off the floor. Avoid raising your torso higher than that because it can put too much stress on your low back and does not help to strengthen your abdominal muscles. Slowly return to your starting position. Back lifts Repeat these steps 5-10 times: Lie on your abdomen (face-down) with your arms at your sides, and rest your forehead on the floor. Tighten the muscles in your legs and your buttocks. Slowly lift your chest off the floor while you keep your hips pressed to the floor. Keep the  back of your head in line with the curve in your back. Your eyes should be looking at the floor. Hold this position for 3-5 seconds. Slowly return to your starting position. Contact a health care provider if: Your back pain or discomfort gets much worse when you do an exercise. Your worsening back pain or discomfort does not lessen within 2 hours after you exercise. If you have any of these problems, stop doing these exercises right away. Do not do them again unless your health care provider says that you can. Get help right away if: You develop sudden, severe back pain. If this happens, stop doing the exercises right away. Do not do them again unless your health care provider says that you can. This information is not intended to replace advice given to you by your health care provider. Make sure you discuss any questions you have with your healthcare provider. Document Revised: 03/18/2019 Document Reviewed: 08/13/2018 Elsevier Patient Education  [place].

## 2021-08-09 ENCOUNTER — Encounter: Payer: Self-pay | Admitting: Family Medicine

## 2021-08-22 ENCOUNTER — Encounter: Payer: Self-pay | Admitting: Family Medicine

## 2021-08-29 ENCOUNTER — Encounter: Payer: Self-pay | Admitting: Family Medicine

## 2021-10-08 ENCOUNTER — Other Ambulatory Visit: Payer: Self-pay

## 2021-10-09 ENCOUNTER — Ambulatory Visit (INDEPENDENT_AMBULATORY_CARE_PROVIDER_SITE_OTHER): Payer: PRIVATE HEALTH INSURANCE | Admitting: Family Medicine

## 2021-10-09 ENCOUNTER — Encounter: Payer: Self-pay | Admitting: Family Medicine

## 2021-10-09 VITALS — BP 132/84 | HR 76 | Temp 96.6°F | Ht 65.0 in | Wt 158.4 lb

## 2021-10-09 DIAGNOSIS — L920 Granuloma annulare: Secondary | ICD-10-CM

## 2021-10-09 DIAGNOSIS — Z23 Encounter for immunization: Secondary | ICD-10-CM | POA: Diagnosis not present

## 2021-10-09 DIAGNOSIS — D229 Melanocytic nevi, unspecified: Secondary | ICD-10-CM

## 2021-10-09 DIAGNOSIS — G500 Trigeminal neuralgia: Secondary | ICD-10-CM

## 2021-10-09 DIAGNOSIS — Z872 Personal history of diseases of the skin and subcutaneous tissue: Secondary | ICD-10-CM | POA: Diagnosis not present

## 2021-10-09 NOTE — Progress Notes (Signed)
Established Patient Office Visit  Subjective:  Patient ID: Tara Avery, female    DOB: May 23, 1961  Age: 60 y.o. MRN: [id number]  CC:  Chief Complaint  Patient presents with   Follow-up    6 month follow up no concerns. Would like referral to dermatologist for granuloma and moles. Tingling on scalp that come and go.     HPI Tara Avery presents for follow-up of elevated blood pressure, moles of concern and elevated cholesterol.  Cholesterol is well controlled with low-dose atorvastatin.  She is tolerating it well.  Blood pressure is improved today.  Goes up and down she tells me.  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  She does have some moles of concern.  She would like to see a dermatologist.  Reluctant to have the flu shot.  She has had her COVID vaccinations.  She has been experiencing tingling in the right anterior portion of her scalp every few days.  It lasts a minute or so.  Denies headache, changes in vision, nausea or vomiting.  Denies diplopia.  Distant history of migraines that have not bothered her in quite some time.  Past Medical History:  Diagnosis Date   Allergy    Arthritis    Chicken pox    Depression    Emphysema of lung (HCC)    when young   Frequent headaches    GERD (gastroesophageal reflux disease)    Hyperlipidemia    Migraine    Urinary tract infection     Past Surgical History:  Procedure Laterality Date   ABDOMINAL HYSTERECTOMY  2002   partial   BUNIONECTOMY Left 06/1996    Family History  Problem Relation Age of Onset   Alcohol abuse Mother    Arthritis Mother    Depression Mother    Hyperlipidemia Mother    Hypertension Mother    Kidney disease Mother    Alcohol abuse Father    Early death Father    Aneurysm Father    Depression Sister    Diabetes Sister    Heart attack Sister    Hyperlipidemia Sister    Hypertension Sister    47 / [place] Sister    Stroke Sister    Healthy Daughter     Heart attack Maternal Grandfather    Hypertension Maternal Grandfather    Hyperlipidemia Maternal Grandfather    Arthritis Sister     Social History   Socioeconomic History   Marital status: Married    Spouse name: Not on file   Number of children: Not on file   Years of education: Not on file   Highest education level: Not on file  Occupational History   Not on file  Tobacco Use   Smoking status: Never   Smokeless tobacco: Never  Vaping Use   Vaping Use: Never used  Substance and Sexual Activity   Alcohol use: No    Comment: rarely   Drug use: No   Sexual activity: Yes    Partners: Male    Comment: 1st intercourse- 54, partner- 61, married- 73 yrs   Other Topics Concern   Not on file  Social History Narrative   Not on file   Social Determinants of Health   Financial Resource Strain: Not on file  Food Insecurity: Not on file  Transportation Needs: Not on file  Physical Activity: Not on file  Stress: Not on file  Social Connections: Not on file  Intimate Partner Violence: Not on file  Outpatient Medications Prior to Visit  Medication Sig Dispense Refill   atorvastatin (LIPITOR) 10 MG tablet TAKE 1 TABLET BY MOUTH EVERY DAY 90 tablet 3   cetirizine (ZYRTEC) 10 MG tablet Take 10 mg by mouth daily.     Cholecalciferol (VITAMIN D3) 2000 units TABS Take 1 tablet by mouth daily.     diphenhydrAMINE (BENADRYL) 25 MG tablet Take 25 mg by mouth as needed.     ibuprofen (ADVIL,MOTRIN) 200 MG tablet Take 200 mg by mouth as needed.     Multiple Vitamins-Minerals (CENTRUM WOMEN PO) Take 0.5 tablets by mouth daily.     Simethicone (GAS-X PO) Take 1 tablet by mouth as needed.     fluticasone (FLONASE) 50 MCG/ACT nasal spray Place 1 spray into both nostrils as needed for allergies or rhinitis. (Patient not taking: Reported on 10/09/2021)     No facility-administered medications prior to visit.    Allergies  Allergen Reactions   Prednisone Palpitations    Patient more  recently reported sob while taking it. I consider this to be more of a significant reaction.    Codeine    Latex     ROS Review of Systems  Constitutional:  Negative for diaphoresis, fatigue, fever and unexpected weight change.  HENT:  Positive for congestion, postnasal drip and rhinorrhea.   Eyes:  Negative for photophobia and visual disturbance.  Respiratory: Negative.    Cardiovascular: Negative.   Gastrointestinal: Negative.   Endocrine: Negative for polyphagia and polyuria.  Genitourinary: Negative.   Musculoskeletal: Negative.   Allergic/Immunologic: Negative for immunocompromised state.  Neurological:  Positive for numbness. Negative for dizziness, facial asymmetry, speech difficulty, weakness, light-headedness and headaches.  Psychiatric/Behavioral: Negative.       Objective:    Physical Exam Vitals and nursing note reviewed.  Constitutional:      General: She is not in acute distress.    Appearance: Normal appearance. She is not ill-appearing, toxic-appearing or diaphoretic.  HENT:     Head: Normocephalic and atraumatic.     Right Ear: Tympanic membrane, ear canal and external ear normal.     Left Ear: Tympanic membrane, ear canal and external ear normal.     Mouth/Throat:     Mouth: Mucous membranes are moist.     Pharynx: Oropharynx is clear. No oropharyngeal exudate or posterior oropharyngeal erythema.  Eyes:     General: No visual field deficit or scleral icterus.       Right eye: No discharge.        Left eye: No discharge.     Extraocular Movements: Extraocular movements intact.     Conjunctiva/sclera: Conjunctivae normal.     Pupils: Pupils are equal, round, and reactive to light.  Cardiovascular:     Rate and Rhythm: Normal rate and regular rhythm.  Pulmonary:     Effort: Pulmonary effort is normal.     Breath sounds: Normal breath sounds.  Abdominal:     General: Bowel sounds are normal.  Musculoskeletal:     Cervical back: No rigidity or  tenderness.  Lymphadenopathy:     Cervical: No cervical adenopathy.  Skin:    General: Skin is warm and dry.       Neurological:     Mental Status: She is alert and oriented to person, place, and time.     Cranial Nerves: No cranial nerve deficit, dysarthria or facial asymmetry.     Motor: Motor function is intact.  Psychiatric:        Mood  and Affect: Mood normal.        Behavior: Behavior normal.    BP 132/84 (BP Location: Right Arm, Patient Position: Sitting, Cuff Size: Normal)   Pulse 76   Temp (!) 96.6 F (35.9 C) (Temporal)   Ht 5\' 5"  (1.651 m)   Wt 158 lb 6.4 oz (71.8 kg)   SpO2 97%   BMI 26.36 kg/m  Wt Readings from Last 3 Encounters:  10/09/21 158 lb 6.4 oz (71.8 kg)  07/06/21 159 lb 9.6 oz (72.4 kg)  05/24/21 156 lb 9.6 oz (71 kg)     Health Maintenance Due  Topic Date Due   Pneumococcal Vaccine 44-61 Years old (1 - PCV) Never done   TETANUS/TDAP  Never done   Zoster Vaccines- Shingrix (1 of 2) Never done   MAMMOGRAM  07/23/2020    There are no preventive care reminders to display for this patient.  Lab Results  Component Value Date   TSH 1.22 01/07/2018   Lab Results  Component Value Date   WBC 5.4 05/24/2021   HGB 13.6 05/24/2021   HCT 39.1 05/24/2021   MCV 88.6 05/24/2021   PLT 228.0 05/24/2021   Lab Results  Component Value Date   NA 139 05/24/2021   K 4.2 05/24/2021   CO2 30 05/24/2021   GLUCOSE 82 05/24/2021   BUN 12 05/24/2021   CREATININE 0.72 05/24/2021   BILITOT 0.5 05/24/2021   ALKPHOS 74 05/24/2021   AST 22 05/24/2021   ALT 22 05/24/2021   PROT 6.7 05/24/2021   ALBUMIN 4.3 05/24/2021   CALCIUM 9.3 05/24/2021   GFR 90.17 05/24/2021   Lab Results  Component Value Date   CHOL 142 05/24/2021   Lab Results  Component Value Date   HDL 48.50 05/24/2021   Lab Results  Component Value Date   LDLCALC 68 05/24/2021   Lab Results  Component Value Date   TRIG 123.0 05/24/2021   Lab Results  Component Value Date    CHOLHDL 3 05/24/2021   No results found for: HGBA1C    Assessment & Plan:   Problem List Items Addressed This Visit       Musculoskeletal and Integument   Granuloma annulare - Primary   Relevant Orders   Ambulatory referral to Dermatology   Atypical nevi   Relevant Orders   Ambulatory referral to Dermatology     Other   History of psoriasis   Other Visit Diagnoses     Flu vaccine need       Relevant Orders   Flu Vaccine QUAD 6+ mos PF IM (Fluarix Quad PF) (Completed)   Trigeminal neuralgia of right side of face           No orders of the defined types were placed in this encounter. Trigeminal neuralgia versus migraine equivalent.  She will let me know if it changes.  Neurology referral..  Follow-up: Return in about 6 months (around 04/08/2022).   Flu vaccine today.  Information was given about the Shingrix vaccine.  Status post negative Cologuard in June of this year.  Dermatology follow-up for atypical nevi and check on granuloma annulare. Libby Maw, MD

## 2021-10-09 NOTE — Progress Notes (Deleted)
Established Patient Office Visit  Subjective:  Patient ID: Tara Avery, female    DOB: 01-Apr-1961  Age: 60 y.o. MRN: [id number]  CC:  Chief Complaint  Patient presents with   Follow-up    6 month follow up no concerns. Would like referral to dermatologist for granuloma and moles. Tingling on scalp that come and go.     HPI Tara Avery presents for ***  Past Medical History:  Diagnosis Date   Allergy    Arthritis    Chicken pox    Depression    Emphysema of lung ([place])    when young   Frequent headaches    GERD (gastroesophageal reflux disease)    Hyperlipidemia    Migraine    Urinary tract infection     Past Surgical History:  Procedure Laterality Date   ABDOMINAL HYSTERECTOMY  2002   partial   BUNIONECTOMY Left 06/1996    Family History  Problem Relation Age of Onset   Alcohol abuse Mother    Arthritis Mother    Depression Mother    Hyperlipidemia Mother    Hypertension Mother    Kidney disease Mother    Alcohol abuse Father    Early death Father    Aneurysm Father    Depression Sister    Diabetes Sister    Heart attack Sister    Hyperlipidemia Sister    Hypertension Sister    89 / [place] Sister    Stroke Sister    Healthy Daughter    Heart attack Maternal Grandfather    Hypertension Maternal Grandfather    Hyperlipidemia Maternal Grandfather    Arthritis Sister     Social History   Socioeconomic History   Marital status: Married    Spouse name: Not on file   Number of children: Not on file   Years of education: Not on file   Highest education level: Not on file  Occupational History   Not on file  Tobacco Use   Smoking status: Never   Smokeless tobacco: Never  Vaping Use   Vaping Use: Never used  Substance and Sexual Activity   Alcohol use: No    Comment: rarely   Drug use: No   Sexual activity: Yes    Partners: Male    Comment: 1st intercourse- 67, partner- 18, married- 47 yrs   Other Topics Concern   Not on  file  Social History Narrative   Not on file   Social Determinants of Health   Financial Resource Strain: Not on file  Food Insecurity: Not on file  Transportation Needs: Not on file  Physical Activity: Not on file  Stress: Not on file  Social Connections: Not on file  Intimate Partner Violence: Not on file    Outpatient Medications Prior to Visit  Medication Sig Dispense Refill   atorvastatin (LIPITOR) 10 MG tablet TAKE 1 TABLET BY MOUTH EVERY DAY 90 tablet 3   cetirizine (ZYRTEC) 10 MG tablet Take 10 mg by mouth daily.     Cholecalciferol (VITAMIN D3) 2000 units TABS Take 1 tablet by mouth daily.     diphenhydrAMINE (BENADRYL) 25 MG tablet Take 25 mg by mouth as needed.     ibuprofen (ADVIL,MOTRIN) 200 MG tablet Take 200 mg by mouth as needed.     Multiple Vitamins-Minerals (CENTRUM WOMEN PO) Take 0.5 tablets by mouth daily.     Simethicone (GAS-X PO) Take 1 tablet by mouth as needed.     fluticasone (FLONASE) 50 MCG/ACT nasal  spray Place 1 spray into both nostrils as needed for allergies or rhinitis. (Patient not taking: Reported on 10/09/2021)     No facility-administered medications prior to visit.    Allergies  Allergen Reactions   Prednisone Palpitations    Patient more recently reported sob while taking it. I consider this to be more of a significant reaction.    Codeine    Latex     ROS Review of Systems    Objective:    Physical Exam  BP 132/84 (BP Location: Right Arm, Patient Position: Sitting, Cuff Size: Normal)   Pulse 76   Temp (!) 96.6 F (35.9 C) (Temporal)   Ht 5\' 5"  (1.651 m)   Wt 158 lb 6.4 oz (71.8 kg)   SpO2 97%   BMI 26.36 kg/m  Wt Readings from Last 3 Encounters:  10/09/21 158 lb 6.4 oz (71.8 kg)  07/06/21 159 lb 9.6 oz (72.4 kg)  05/24/21 156 lb 9.6 oz (71 kg)     Health Maintenance Due  Topic Date Due   Pneumococcal Vaccine 61-80 Years old (1 - PCV) Never done   TETANUS/TDAP  Never done   Zoster Vaccines- Shingrix (1 of 2)  Never done   MAMMOGRAM  07/23/2020    There are no preventive care reminders to display for this patient.  Lab Results  Component Value Date   TSH 1.22 01/07/2018   Lab Results  Component Value Date   WBC 5.4 05/24/2021   HGB 13.6 05/24/2021   HCT 39.1 05/24/2021   MCV 88.6 05/24/2021   PLT 228.0 05/24/2021   Lab Results  Component Value Date   NA 139 05/24/2021   K 4.2 05/24/2021   CO2 30 05/24/2021   GLUCOSE 82 05/24/2021   BUN 12 05/24/2021   CREATININE 0.72 05/24/2021   BILITOT 0.5 05/24/2021   ALKPHOS 74 05/24/2021   AST 22 05/24/2021   ALT 22 05/24/2021   PROT 6.7 05/24/2021   ALBUMIN 4.3 05/24/2021   CALCIUM 9.3 05/24/2021   GFR 90.17 05/24/2021   Lab Results  Component Value Date   CHOL 142 05/24/2021   Lab Results  Component Value Date   HDL 48.50 05/24/2021   Lab Results  Component Value Date   LDLCALC 68 05/24/2021   Lab Results  Component Value Date   TRIG 123.0 05/24/2021   Lab Results  Component Value Date   CHOLHDL 3 05/24/2021   No results found for: HGBA1C    Assessment & Plan:   Problem List Items Addressed This Visit       Musculoskeletal and Integument   Granuloma annulare - Primary   Relevant Orders   Ambulatory referral to Dermatology   Atypical nevi   Relevant Orders   Ambulatory referral to Dermatology     Other   History of psoriasis   Other Visit Diagnoses     Flu vaccine need       Relevant Orders   Flu Vaccine QUAD 6+ mos PF IM (Fluarix Quad PF) (Completed)   Trigeminal neuralgia of right side of face           No orders of the defined types were placed in this encounter.   Follow-up: Return in about 6 months (around 04/08/2022).    Tara Maw, MD

## 2021-10-11 ENCOUNTER — Other Ambulatory Visit (HOSPITAL_BASED_OUTPATIENT_CLINIC_OR_DEPARTMENT_OTHER): Payer: Self-pay | Admitting: Family Medicine

## 2021-10-11 DIAGNOSIS — Z0000 Encounter for general adult medical examination without abnormal findings: Secondary | ICD-10-CM

## 2021-11-12 ENCOUNTER — Ambulatory Visit (HOSPITAL_BASED_OUTPATIENT_CLINIC_OR_DEPARTMENT_OTHER)
Admission: RE | Admit: 2021-11-12 | Discharge: 2021-11-12 | Disposition: A | Discharge status: Home / Self Care | Payer: PRIVATE HEALTH INSURANCE | Source: Ambulatory Visit | Attending: Family Medicine | Admitting: Family Medicine

## 2021-11-12 ENCOUNTER — Encounter (HOSPITAL_BASED_OUTPATIENT_CLINIC_OR_DEPARTMENT_OTHER): Payer: Self-pay

## 2021-11-12 ENCOUNTER — Other Ambulatory Visit: Payer: Self-pay

## 2021-11-12 DIAGNOSIS — Z0000 Encounter for general adult medical examination without abnormal findings: Secondary | ICD-10-CM

## 2021-12-15 ENCOUNTER — Encounter: Payer: Self-pay | Admitting: Family Medicine

## 2021-12-28 ENCOUNTER — Other Ambulatory Visit: Payer: Self-pay

## 2021-12-28 ENCOUNTER — Ambulatory Visit (INDEPENDENT_AMBULATORY_CARE_PROVIDER_SITE_OTHER): Payer: PRIVATE HEALTH INSURANCE | Admitting: Family Medicine

## 2021-12-28 ENCOUNTER — Encounter: Payer: Self-pay | Admitting: Family Medicine

## 2021-12-28 VITALS — BP 152/80 | HR 84 | Temp 97.7°F | Ht 65.0 in | Wt 161.4 lb

## 2021-12-28 DIAGNOSIS — M85859 Other specified disorders of bone density and structure, unspecified thigh: Secondary | ICD-10-CM

## 2021-12-28 DIAGNOSIS — I10 Essential (primary) hypertension: Secondary | ICD-10-CM | POA: Diagnosis not present

## 2021-12-28 DIAGNOSIS — E7800 Pure hypercholesterolemia, unspecified: Secondary | ICD-10-CM | POA: Diagnosis not present

## 2021-12-28 MED ORDER — LISINOPRIL 10 MG PO TABS: 10.0000 mg | ORAL_TABLET | Freq: Every day | ORAL | 3 refills | Status: DC

## 2021-12-28 NOTE — Progress Notes (Addendum)
Established Patient Office Visit  Subjective:  Patient ID: Tara Avery, female    DOB: Jul 12, 1961  Age: 61 y.o. MRN: [id number]  CC:  Chief Complaint  Patient presents with   Advice Only    Discuss bone density     HPI Tara Avery presents for follow-up of elevated cholesterol, bone density amatory testing and elevated blood pressure without a diagnosis of hypertension.  Pressure has been elevated at home.  She has felt a tingling around her head.  She is having no issues taking atorvastatin.  Past Medical History:  Diagnosis Date   Allergy    Arthritis    Chicken pox    Depression    Emphysema of lung (HCC)    when young   Frequent headaches    GERD (gastroesophageal reflux disease)    Hyperlipidemia    Migraine    Urinary tract infection     Past Surgical History:  Procedure Laterality Date   ABDOMINAL HYSTERECTOMY  2002   partial   BUNIONECTOMY Left 06/1996    Family History  Problem Relation Age of Onset   Alcohol abuse Mother    Arthritis Mother    Depression Mother    Hyperlipidemia Mother    Hypertension Mother    Kidney disease Mother    Alcohol abuse Father    Early death Father    Aneurysm Father    Depression Sister    Diabetes Sister    Heart attack Sister    Hyperlipidemia Sister    Hypertension Sister    46 / [place] Sister    Stroke Sister    Healthy Daughter    Heart attack Maternal Grandfather    Hypertension Maternal Grandfather    Hyperlipidemia Maternal Grandfather    Arthritis Sister     Social History   Socioeconomic History   Marital status: Married    Spouse name: Not on file   Number of children: Not on file   Years of education: Not on file   Highest education level: Not on file  Occupational History   Not on file  Tobacco Use   Smoking status: Never   Smokeless tobacco: Never  Vaping Use   Vaping Use: Never used  Substance and Sexual Activity   Alcohol use: No    Comment: rarely   Drug  use: No   Sexual activity: Yes    Partners: Male    Comment: 1st intercourse- 38, partner- 83, married- 31 yrs   Other Topics Concern   Not on file  Social History Narrative   Not on file   Social Determinants of Health   Financial Resource Strain: Not on file  Food Insecurity: Not on file  Transportation Needs: Not on file  Physical Activity: Not on file  Stress: Not on file  Social Connections: Not on file  Intimate Partner Violence: Not on file    Outpatient Medications Prior to Visit  Medication Sig Dispense Refill   atorvastatin (LIPITOR) 10 MG tablet TAKE 1 TABLET BY MOUTH EVERY DAY 90 tablet 3   cetirizine (ZYRTEC) 10 MG tablet Take 10 mg by mouth daily.     Cholecalciferol (VITAMIN D3) 2000 units TABS Take 1 tablet by mouth daily.     diphenhydrAMINE (BENADRYL) 25 MG tablet Take 25 mg by mouth as needed.     ibuprofen (ADVIL,MOTRIN) 200 MG tablet Take 200 mg by mouth as needed.     MELATONIN ER PO Take by mouth.     Multiple Vitamins-Minerals (  CENTRUM WOMEN PO) Take 0.5 tablets by mouth daily.     Simethicone (GAS-X PO) Take 1 tablet by mouth as needed.     No facility-administered medications prior to visit.    Allergies  Allergen Reactions   Prednisone Palpitations    Patient more recently reported sob while taking it. I consider this to be more of a significant reaction.    Codeine    Latex     ROS Review of Systems  Constitutional:  Negative for diaphoresis, fatigue, fever and unexpected weight change.  HENT: Negative.    Respiratory: Negative.    Cardiovascular: Negative.   Gastrointestinal: Negative.   Genitourinary: Negative.   Musculoskeletal:  Negative for gait problem and joint swelling.  Neurological:  Negative for dizziness, light-headedness and headaches.     Objective:    Physical Exam Vitals and nursing note reviewed.  Constitutional:      General: She is not in acute distress.    Appearance: Normal appearance. She is not  ill-appearing, toxic-appearing or diaphoretic.  HENT:     Head: Atraumatic.     Right Ear: External ear normal.     Left Ear: External ear normal.  Eyes:     General: No scleral icterus.       Right eye: No discharge.        Left eye: No discharge.     Extraocular Movements: Extraocular movements intact.     Conjunctiva/sclera: Conjunctivae normal.  Pulmonary:     Effort: Pulmonary effort is normal.  Skin:    General: Skin is warm and dry.  Neurological:     Mental Status: She is alert and oriented to person, place, and time.  Psychiatric:        Mood and Affect: Mood normal.        Behavior: Behavior normal.    BP (!) 152/80 (BP Location: Right Arm, Patient Position: Sitting, Cuff Size: Normal)    Pulse 84    Temp 97.7 F (36.5 C) (Temporal)    Ht 5\' 5"  (1.651 m)    Wt 161 lb 6.4 oz (73.2 kg)    SpO2 96%    BMI 26.86 kg/m  Wt Readings from Last 3 Encounters:  12/28/21 161 lb 6.4 oz (73.2 kg)  10/09/21 158 lb 6.4 oz (71.8 kg)  07/06/21 159 lb 9.6 oz (72.4 kg)     Health Maintenance Due  Topic Date Due   TETANUS/TDAP  Never done   Zoster Vaccines- Shingrix (1 of 2) Never done   COVID-19 Vaccine (6 - Booster for Pfizer series) 10/19/2021    There are no preventive care reminders to display for this patient.  Lab Results  Component Value Date   TSH 1.22 01/07/2018   Lab Results  Component Value Date   WBC 5.4 05/24/2021   HGB 13.6 05/24/2021   HCT 39.1 05/24/2021   MCV 88.6 05/24/2021   PLT 228.0 05/24/2021   Lab Results  Component Value Date   NA 143 12/28/2021   K 3.9 12/28/2021   CO2 25 12/28/2021   GLUCOSE 98 12/28/2021   BUN 19 12/28/2021   CREATININE 0.96 12/28/2021   BILITOT 0.5 05/24/2021   ALKPHOS 74 05/24/2021   AST 22 05/24/2021   ALT 22 05/24/2021   PROT 6.7 05/24/2021   ALBUMIN 4.3 05/24/2021   CALCIUM 9.7 12/28/2021   GFR 90.17 05/24/2021   Lab Results  Component Value Date   CHOL 142 05/24/2021   Lab Results  Component Value Date  HDL 48.50 05/24/2021   Lab Results  Component Value Date   LDLCALC 68 05/24/2021   Lab Results  Component Value Date   TRIG 123.0 05/24/2021   Lab Results  Component Value Date   CHOLHDL 3 05/24/2021   No results found for: HGBA1C    Assessment & Plan:   Problem List Items Addressed This Visit       Cardiovascular and Mediastinum   Essential hypertension - Primary   Relevant Medications   losartan (COZAAR) 50 MG tablet   Other Relevant Orders   Basic metabolic panel (Completed)     Musculoskeletal and Integument   Osteopenia of neck of femur     Other   Elevated LDL cholesterol level   Relevant Orders   LDL cholesterol, direct (Completed)    Meds ordered this encounter  Medications   DISCONTD: lisinopril (ZESTRIL) 10 MG tablet    Sig: Take 1 tablet (10 mg total) by mouth daily.    Dispense:  90 tablet    Refill:  3   losartan (COZAAR) 50 MG tablet    Sig: Take 1 tablet (50 mg total) by mouth daily.    Dispense:  90 tablet    Refill:  0    Follow-up: Return in about 3 months (around 03/27/2022).  30-minute discussion about treating patient for osteoporosis versus osteopenia.  With shared decision making we decided to treat her for osteopenia with 1200 mg of calcium daily and vitamin D that she is already taking.  Continue atorvastatin.  We will start on low-dose lisinopril and titrate upwards..  She was asked to look out for a dry cough.  She was given information on osteopenia, lisinopril and managing hypertension.  Libby Maw, MD  2/13 addendum: multiple side effects with linsinopril. Will discontinue it. Will start losartan 50mg  daily. And fu in one month.

## 2021-12-30 LAB — BASIC METABOLIC PANEL
BUN: 19 mg/dL (ref 7–25)
CO2: 25 mmol/L (ref 20–32)
Calcium: 9.7 mg/dL (ref 8.6–10.4)
Chloride: 104 mmol/L (ref 98–110)
Creat: 0.96 mg/dL (ref 0.50–1.05)
Glucose, Bld: 98 mg/dL (ref 65–99)
Potassium: 3.9 mmol/L (ref 3.5–5.3)
Sodium: 143 mmol/L (ref 135–146)

## 2021-12-30 LAB — LDL CHOLESTEROL, DIRECT: Direct LDL: 87 mg/dL (ref <100)

## 2022-01-01 ENCOUNTER — Encounter: Payer: Self-pay | Admitting: Family Medicine

## 2022-01-07 ENCOUNTER — Encounter: Payer: Self-pay | Admitting: Family Medicine

## 2022-01-07 MED ORDER — LOSARTAN POTASSIUM 50 MG PO TABS: 50.0000 mg | ORAL_TABLET | Freq: Every day | ORAL | 0 refills | Status: DC

## 2022-01-07 NOTE — Addendum Note (Signed)
Addended by: Jon Billings on: 01/07/2022 05:15 PM   Modules accepted: Orders

## 2022-02-12 ENCOUNTER — Ambulatory Visit (INDEPENDENT_AMBULATORY_CARE_PROVIDER_SITE_OTHER): Payer: PRIVATE HEALTH INSURANCE | Admitting: Family Medicine

## 2022-02-12 ENCOUNTER — Other Ambulatory Visit: Payer: Self-pay

## 2022-02-12 ENCOUNTER — Encounter: Payer: Self-pay | Admitting: Family Medicine

## 2022-02-12 ENCOUNTER — Ambulatory Visit (INDEPENDENT_AMBULATORY_CARE_PROVIDER_SITE_OTHER): Payer: PRIVATE HEALTH INSURANCE

## 2022-02-12 VITALS — BP 136/72 | HR 88 | Temp 97.9°F | Ht 65.0 in | Wt 159.6 lb

## 2022-02-12 DIAGNOSIS — M940 Chondrocostal junction syndrome [Tietze]: Secondary | ICD-10-CM

## 2022-02-12 DIAGNOSIS — I10 Essential (primary) hypertension: Secondary | ICD-10-CM | POA: Diagnosis not present

## 2022-02-12 MED ORDER — MELOXICAM 7.5 MG PO TABS: 7.5000 mg | ORAL_TABLET | Freq: Every day | ORAL | 0 refills | Status: DC

## 2022-02-12 NOTE — Progress Notes (Signed)
? ?Established Patient Office Visit ? ?Subjective:  ?Patient ID: Tara Avery, female    DOB: 09/13/1961  Age: 60 y.o. MRN: [id number] ? ?CC:  ?Chief Complaint  ?Patient presents with  ? Back Pain  ?  Pt. C/o pain under left breast with deep breath x 1 month.  ? ? ?HPI ?Tara Avery presents for follow-up of hypertension and pain in her left anterior inferior chest.  Doing well with the losartan.  Blood pressure has improved greatly on it.  She is having no issues taking the medication as far she knows.  She is been experiencing pain in her left anterior chest wall beneath her left breast.  More noticeable when she takes a deep breath.  There is tenderness to palpation or movement through the chest wall.  There is no radiation of pain shortness of breath or dyspnea.  There is no nausea or diaphoresis.  She can think of no injury.  Denies fever or cough. ? ?Past Medical History:  ?Diagnosis Date  ? Allergy   ? Arthritis   ? Chicken pox   ? Depression   ? Emphysema of lung ([place])   ? when young  ? Frequent headaches   ? GERD (gastroesophageal reflux disease)   ? Hyperlipidemia   ? Migraine   ? Urinary tract infection   ? ? ?Past Surgical History:  ?Procedure Laterality Date  ? ABDOMINAL HYSTERECTOMY  2002  ? partial  ? BUNIONECTOMY Left 06/1996  ? ? ?Family History  ?Problem Relation Age of Onset  ? Alcohol abuse Mother   ? Arthritis Mother   ? Depression Mother   ? Hyperlipidemia Mother   ? Hypertension Mother   ? Kidney disease Mother   ? Alcohol abuse Father   ? Early death Father   ? Aneurysm Father   ? Depression Sister   ? Diabetes Sister   ? Heart attack Sister   ? Hyperlipidemia Sister   ? Hypertension Sister   ? Miscarriages / Stillbirths Sister   ? Stroke Sister   ? Healthy Daughter   ? Heart attack Maternal Grandfather   ? Hypertension Maternal Grandfather   ? Hyperlipidemia Maternal Grandfather   ? Arthritis Sister   ? ? ?Social History  ? ?Socioeconomic History  ? Marital status: Married  ?  Spouse  name: Not on file  ? Number of children: Not on file  ? Years of education: Not on file  ? Highest education level: Not on file  ?Occupational History  ? Not on file  ?Tobacco Use  ? Smoking status: Never  ? Smokeless tobacco: Never  ?Vaping Use  ? Vaping Use: Never used  ?Substance and Sexual Activity  ? Alcohol use: No  ?  Comment: rarely  ? Drug use: No  ? Sexual activity: Yes  ?  Partners: Male  ?  Comment: 1st intercourse- 52, partner- 1, married- 57 yrs   ?Other Topics Concern  ? Not on file  ?Social History Narrative  ? Not on file  ? ?Social Determinants of Health  ? ?Financial Resource Strain: Not on file  ?Food Insecurity: Not on file  ?Transportation Needs: Not on file  ?Physical Activity: Not on file  ?Stress: Not on file  ?Social Connections: Not on file  ?Intimate Partner Violence: Not on file  ? ? ?Outpatient Medications Prior to Visit  ?Medication Sig Dispense Refill  ? atorvastatin (LIPITOR) 10 MG tablet TAKE 1 TABLET BY MOUTH EVERY DAY 90 tablet 3  ? cetirizine (ZYRTEC)  10 MG tablet Take 10 mg by mouth daily.    ? Cholecalciferol (VITAMIN D3) 2000 units TABS Take 1 tablet by mouth daily.    ? diphenhydrAMINE (BENADRYL) 25 MG tablet Take 25 mg by mouth as needed.    ? losartan (COZAAR) 50 MG tablet Take 1 tablet (50 mg total) by mouth daily. 90 tablet 0  ? MELATONIN ER PO Take by mouth.    ? Multiple Vitamins-Minerals (CENTRUM WOMEN PO) Take 0.5 tablets by mouth daily.    ? Simethicone (GAS-X PO) Take 1 tablet by mouth as needed.    ? ibuprofen (ADVIL,MOTRIN) 200 MG tablet Take 200 mg by mouth as needed. (Patient not taking: Reported on 02/12/2022)    ? ?No facility-administered medications prior to visit.  ? ? ?Allergies  ?Allergen Reactions  ? Prednisone Palpitations  ?  Patient more recently reported sob while taking it. I consider this to be more of a significant reaction.   ? Codeine   ? Latex   ? ? ?ROS ?Review of Systems  ?Constitutional:  Negative for diaphoresis, fatigue, fever and  unexpected weight change.  ?HENT: Negative.    ?Eyes:  Negative for photophobia and visual disturbance.  ?Respiratory:  Negative for cough, chest tightness and wheezing.   ?Cardiovascular:  Positive for chest pain. Negative for palpitations and leg swelling.  ?Gastrointestinal:  Negative for nausea.  ?Genitourinary: Negative.   ?Skin:  Negative for rash.  ?Neurological:  Negative for weakness and light-headedness.  ? ?  ?Objective:  ?  ?Physical Exam ?Vitals and nursing note reviewed.  ?Constitutional:   ?   General: She is not in acute distress. ?   Appearance: Normal appearance. She is not ill-appearing, toxic-appearing or diaphoretic.  ?HENT:  ?   Head: Normocephalic and atraumatic.  ?   Right Ear: External ear normal.  ?   Left Ear: External ear normal.  ?   Mouth/Throat:  ?   Mouth: Mucous membranes are moist.  ?   Pharynx: Oropharynx is clear. No oropharyngeal exudate or posterior oropharyngeal erythema.  ?Eyes:  ?   General: No scleral icterus.    ?   Right eye: No discharge.     ?   Left eye: No discharge.  ?   Conjunctiva/sclera: Conjunctivae normal.  ?   Pupils: Pupils are equal, round, and reactive to light.  ?Cardiovascular:  ?   Rate and Rhythm: Normal rate and regular rhythm.  ?Pulmonary:  ?   Effort: Pulmonary effort is normal.  ?   Breath sounds: Normal breath sounds.  ?Abdominal:  ?   General: Abdomen is flat. Bowel sounds are normal. There is no distension.  ?   Palpations: Abdomen is soft. There is no mass.  ?   Tenderness: There is no abdominal tenderness. There is no guarding or rebound.  ?   Hernia: No hernia is present.  ?Musculoskeletal:  ?   Cervical back: No rigidity or tenderness.  ?Lymphadenopathy:  ?   Cervical: No cervical adenopathy.  ?Skin: ?   General: Skin is warm and dry.  ?Neurological:  ?   Mental Status: She is alert and oriented to person, place, and time.  ?Psychiatric:     ?   Mood and Affect: Mood normal.     ?   Behavior: Behavior normal.  ? ? ?BP 136/72 (BP Location: Left  Arm, Patient Position: Sitting, Cuff Size: Normal)   Pulse 88   Temp 97.9 ?F (36.6 ?C) (Temporal)   Ht 5'  5" (1.651 m)   Wt 159 lb 9.6 oz (72.4 kg)   SpO2 96%   BMI 26.56 kg/m?  ?Wt Readings from Last 3 Encounters:  ?02/12/22 159 lb 9.6 oz (72.4 kg)  ?12/28/21 161 lb 6.4 oz (73.2 kg)  ?10/09/21 158 lb 6.4 oz (71.8 kg)  ? ? ? ?Health Maintenance Due  ?Topic Date Due  ? TETANUS/TDAP  Never done  ? Zoster Vaccines- Shingrix (1 of 2) Never done  ? COVID-19 Vaccine (6 - Booster for [place] series) 10/19/2021  ? ? ?There are no preventive care reminders to display for this patient. ? ?Lab Results  ?Component Value Date  ? TSH 1.22 01/07/2018  ? ?Lab Results  ?Component Value Date  ? WBC 5.4 05/24/2021  ? HGB 13.6 05/24/2021  ? HCT 39.1 05/24/2021  ? MCV 88.6 05/24/2021  ? PLT 228.0 05/24/2021  ? ?Lab Results  ?Component Value Date  ? NA 143 12/28/2021  ? K 3.9 12/28/2021  ? CO2 25 12/28/2021  ? GLUCOSE 98 12/28/2021  ? BUN 19 12/28/2021  ? CREATININE 0.96 12/28/2021  ? BILITOT 0.5 05/24/2021  ? ALKPHOS 74 05/24/2021  ? AST 22 05/24/2021  ? ALT 22 05/24/2021  ? PROT 6.7 05/24/2021  ? ALBUMIN 4.3 05/24/2021  ? CALCIUM 9.7 12/28/2021  ? GFR 90.17 05/24/2021  ? ?Lab Results  ?Component Value Date  ? CHOL 142 05/24/2021  ? ?Lab Results  ?Component Value Date  ? HDL 48.50 05/24/2021  ? ?Lab Results  ?Component Value Date  ? [place] 68 05/24/2021  ? ?Lab Results  ?Component Value Date  ? TRIG 123.0 05/24/2021  ? ?Lab Results  ?Component Value Date  ? CHOLHDL 3 05/24/2021  ? ?No results found for: HGBA1C ? ?  ?Assessment & Plan:  ? ?Problem List Items Addressed This Visit   ? ?  ? Cardiovascular and Mediastinum  ? Essential hypertension - Primary  ? Relevant Orders  ? Basic metabolic panel  ? CBC  ?  ? Musculoskeletal and Integument  ? Costochondritis  ? Relevant Medications  ? meloxicam (MOBIC) 7.5 MG tablet  ? Other Relevant Orders  ? DG Ribs Unilateral Left  ? ? ?Meds ordered this encounter  ?Medications  ? meloxicam  (MOBIC) 7.5 MG tablet  ?  Sig: Take 1 tablet (7.5 mg total) by mouth daily.  ?  Dispense:  30 tablet  ?  Refill:  0  ? ? ?Follow-up: Return if symptoms worsen or fail to improve.  ?Continue losartan for hypertension.  Meta Hatchet

## 2022-02-13 LAB — CBC
HCT: 40.1 % (ref 36.0–46.0)
Hemoglobin: 13.9 g/dL (ref 12.0–15.0)
MCHC: 34.7 g/dL (ref 30.0–36.0)
MCV: 89 fl (ref 78.0–100.0)
Platelets: 256 10*3/uL (ref 150.0–400.0)
RBC: 4.5 Mil/uL (ref 3.87–5.11)
RDW: 12.6 % (ref 11.5–15.5)
WBC: 6.6 10*3/uL (ref 4.0–10.5)

## 2022-02-13 LAB — BASIC METABOLIC PANEL
BUN: 13 mg/dL (ref 6–23)
CO2: 29 mEq/L (ref 19–32)
Calcium: 9.6 mg/dL (ref 8.4–10.5)
Chloride: 104 mEq/L (ref 96–112)
Creatinine, Ser: 0.9 mg/dL (ref 0.40–1.20)
GFR: 68.64 mL/min (ref >60.00)
Glucose, Bld: 86 mg/dL (ref 70–99)
Potassium: 4 mEq/L (ref 3.5–5.1)
Sodium: 141 mEq/L (ref 135–145)

## 2022-02-15 ENCOUNTER — Encounter: Payer: Self-pay | Admitting: Family Medicine

## 2022-04-01 ENCOUNTER — Other Ambulatory Visit: Payer: Self-pay | Admitting: Family Medicine

## 2022-04-01 DIAGNOSIS — I10 Essential (primary) hypertension: Secondary | ICD-10-CM

## 2022-04-10 ENCOUNTER — Ambulatory Visit: Payer: PRIVATE HEALTH INSURANCE | Admitting: Dermatology

## 2022-04-24 ENCOUNTER — Other Ambulatory Visit: Payer: Self-pay | Admitting: Family Medicine

## 2022-04-24 DIAGNOSIS — E7800 Pure hypercholesterolemia, unspecified: Secondary | ICD-10-CM

## 2022-06-03 ENCOUNTER — Encounter: Payer: Self-pay | Admitting: Family Medicine

## 2022-06-03 ENCOUNTER — Ambulatory Visit (INDEPENDENT_AMBULATORY_CARE_PROVIDER_SITE_OTHER): Payer: PRIVATE HEALTH INSURANCE | Admitting: Family Medicine

## 2022-06-03 ENCOUNTER — Ambulatory Visit (INDEPENDENT_AMBULATORY_CARE_PROVIDER_SITE_OTHER): Payer: PRIVATE HEALTH INSURANCE

## 2022-06-03 VITALS — BP 138/78 | HR 78 | Temp 98.0°F | Ht 65.0 in | Wt 155.8 lb

## 2022-06-03 DIAGNOSIS — M19042 Primary osteoarthritis, left hand: Secondary | ICD-10-CM

## 2022-06-03 DIAGNOSIS — I10 Essential (primary) hypertension: Secondary | ICD-10-CM | POA: Diagnosis not present

## 2022-06-03 DIAGNOSIS — R5383 Other fatigue: Secondary | ICD-10-CM

## 2022-06-03 DIAGNOSIS — E7800 Pure hypercholesterolemia, unspecified: Secondary | ICD-10-CM | POA: Diagnosis not present

## 2022-06-03 LAB — LIPID PANEL
Cholesterol: 148 mg/dL (ref 0–200)
HDL: 48.2 mg/dL (ref >39.00)
LDL Cholesterol: 77 mg/dL (ref 0–99)
NonHDL: 100.25
Total CHOL/HDL Ratio: 3
Triglycerides: 116 mg/dL (ref 0.0–149.0)
VLDL: 23.2 mg/dL (ref 0.0–40.0)

## 2022-06-03 LAB — URINALYSIS, ROUTINE W REFLEX MICROSCOPIC
Bilirubin Urine: NEGATIVE
Ketones, ur: NEGATIVE
Leukocytes,Ua: NEGATIVE
Nitrite: NEGATIVE
Specific Gravity, Urine: <=1.005 — AB (ref 1.000–1.030)
Total Protein, Urine: NEGATIVE
Urine Glucose: NEGATIVE
Urobilinogen, UA: 0.2 (ref 0.0–1.0)
pH: 6.5 (ref 5.0–8.0)

## 2022-06-03 LAB — COMPREHENSIVE METABOLIC PANEL
ALT: 16 U/L (ref 0–35)
AST: 18 U/L (ref 0–37)
Albumin: 4.2 g/dL (ref 3.5–5.2)
Alkaline Phosphatase: 67 U/L (ref 39–117)
BUN: 15 mg/dL (ref 6–23)
CO2: 27 mEq/L (ref 19–32)
Calcium: 9.3 mg/dL (ref 8.4–10.5)
Chloride: 105 mEq/L (ref 96–112)
Creatinine, Ser: 0.76 mg/dL (ref 0.40–1.20)
GFR: 83.9 mL/min (ref >60.00)
Glucose, Bld: 91 mg/dL (ref 70–99)
Potassium: 4.2 mEq/L (ref 3.5–5.1)
Sodium: 139 mEq/L (ref 135–145)
Total Bilirubin: 0.4 mg/dL (ref 0.2–1.2)
Total Protein: 6.9 g/dL (ref 6.0–8.3)

## 2022-06-03 LAB — TSH: TSH: 1.4 u[IU]/mL (ref 0.35–5.50)

## 2022-06-03 NOTE — Progress Notes (Signed)
Established Patient Office Visit  Subjective   Patient ID: Tara Avery, female    DOB: [birth year]  Age: 61 y.o. MRN: [id number]  Chief Complaint  Patient presents with   Annual Exam    CPE, concerns about continued aches in hands. Hot flashes becoming worse, facial hair worsening.      HPI follow-up of hypertension, elevated cholesterol and pain in her left hand.  Right-hand-dominant.  Pain is at the base of the thumb.  No specific injury.  Continues losartan for hypertension.  Wonders if it is causing some fatigue.  Continues atorvastatin without issue cholesterol.  Has been experiencing hot flashes.  Blood pressure well controlled with losartan.  Tolerating atorvastatin.    Review of Systems  Constitutional:  Negative for chills, diaphoresis, malaise/fatigue and weight loss.  HENT: Negative.    Eyes: Negative.  Negative for blurred vision and double vision.  Cardiovascular:  Negative for chest pain.  Gastrointestinal:  Negative for abdominal pain.  Genitourinary: Negative.   Musculoskeletal:  Positive for joint pain. Negative for falls and myalgias.  Neurological:  Negative for speech change, loss of consciousness and weakness.      06/03/2022    9:37 AM 06/03/2022    8:54 AM 02/12/2022    2:25 PM  Depression screen PHQ 2/9  Decreased Interest 1 0 1  Down, Depressed, Hopeless 0 0 0  PHQ - 2 Score 1 0 1  Altered sleeping 1    Tired, decreased energy 1    Change in appetite 0    Feeling bad or failure about yourself  0    Trouble concentrating 0    Moving slowly or fidgety/restless 0    Suicidal thoughts 0    PHQ-9 Score 3    Difficult doing work/chores Not difficult at all         Objective:     BP 138/78 (BP Location: Right Arm, Patient Position: Sitting, Cuff Size: Normal)   Pulse 78   Temp 98 F (36.7 C) (Temporal)   Ht '5\' 5"'$  (1.651 m)   Wt 155 lb 12.8 oz (70.7 kg)   SpO2 98%   BMI 25.93 kg/m  BP Readings from Last 3 Encounters:  06/03/22 138/78   02/12/22 136/72  12/28/21 (!) 152/80   Wt Readings from Last 3 Encounters:  06/03/22 155 lb 12.8 oz (70.7 kg)  02/12/22 159 lb 9.6 oz (72.4 kg)  12/28/21 161 lb 6.4 oz (73.2 kg)      Physical Exam Constitutional:      General: She is not in acute distress.    Appearance: Normal appearance. She is not ill-appearing, toxic-appearing or diaphoretic.  HENT:     Head: Normocephalic and atraumatic.     Right Ear: External ear normal.     Left Ear: External ear normal.     Mouth/Throat:     Mouth: Mucous membranes are moist.     Pharynx: Oropharynx is clear. No oropharyngeal exudate or posterior oropharyngeal erythema.  Eyes:     General: No scleral icterus.       Right eye: No discharge.        Left eye: No discharge.     Extraocular Movements: Extraocular movements intact.     Conjunctiva/sclera: Conjunctivae normal.     Pupils: Pupils are equal, round, and reactive to light.  Cardiovascular:     Rate and Rhythm: Normal rate and regular rhythm.  Pulmonary:     Effort: Pulmonary effort is normal. No respiratory distress.  Breath sounds: Normal breath sounds.  Abdominal:     General: Bowel sounds are normal.  Musculoskeletal:       Arms:     Cervical back: No rigidity or tenderness.  Skin:    General: Skin is warm and dry.  Neurological:     Mental Status: She is alert and oriented to person, place, and time.  Psychiatric:        Mood and Affect: Mood normal.        Behavior: Behavior normal.      No results found for any visits on 06/03/22.    The 10-year ASCVD risk score (Arnett DK, et al., 2019) is: 5.4%    Assessment & Plan:   Problem List Items Addressed This Visit       Cardiovascular and Mediastinum   Essential hypertension - Primary   Relevant Orders   Comprehensive metabolic panel   Urinalysis, Routine w reflex microscopic     Musculoskeletal and Integument   Osteoarthritis of hand, primary localized, left   Relevant Orders   DG Hand  Complete Left     Other   Elevated LDL cholesterol level   Relevant Orders   Comprehensive metabolic panel   Lipid panel   Other Visit Diagnoses     Other fatigue       Relevant Orders   TSH       Return in about 6 months (around 12/04/2022).  May use Voltaren gel as needed for pain in the left first carpometacarpal joint.  Explained transmitted ports.  Continue atorvastatin and losartan for cholesterol and hypertension.   Libby Maw, MD

## 2022-06-03 NOTE — Progress Notes (Unsigned)
Initial visit for LEFT hand pain at 1st carpal metacarpal joint. Denies injury

## 2022-06-11 ENCOUNTER — Encounter: Payer: Self-pay | Admitting: Family Medicine

## 2022-06-24 NOTE — Progress Notes (Signed)
Office Visit Note  Patient: Tara Avery             Date of Birth: 12/02/1960           MRN: [id number]             PCP: Libby Maw, MD Referring: Libby Maw,* Visit Date: 07/05/2022 Occupation: '@GUAROCC'$ @  Subjective:  Pain in multiple joints  History of Present Illness: Tara Avery is a 61 y.o. female with history of osteoarthritis and degenerative disc disease.  She states she continues to have pain and swelling in her bilateral hands.  She has been having increased discomfort in her hands especially in the [facility] joints.  She was evaluated by Dr. Alfonso Ramus and had x-ray of her left hand.  She has been also having increased pain and discomfort in her legs.  She states she had some swelling and puffiness in her left knee joint which resolved by itself after couple of days.  She continues to have SI joint discomfort and discomfort in her feet.  She recently had a DEXA scan which showed osteopenia and she would like for me to review it with her.  Activities of Daily Living:  Patient reports morning stiffness for 15 minutes.   Patient Reports nocturnal pain.  Difficulty dressing/grooming: Reports Difficulty climbing stairs: Reports Difficulty getting out of chair: Reports Difficulty using hands for taps, buttons, cutlery, and/or writing: Denies  Review of Systems  Constitutional:  Positive for fatigue.  HENT:  Positive for mouth dryness.   Eyes:  Positive for dryness.  Respiratory:  Negative for shortness of breath.   Cardiovascular:  Positive for palpitations.       Related to stress  Gastrointestinal:  Positive for constipation and diarrhea. Negative for blood in stool.       Related to tums  Endocrine: Negative for increased urination.  Genitourinary:  Positive for nocturia. Negative for involuntary urination.  Musculoskeletal:  Positive for joint pain, joint pain and morning stiffness. Negative for joint swelling, myalgias and myalgias.  Skin:  Negative  for color change, rash and sensitivity to sunlight.  Allergic/Immunologic: Negative for susceptible to infections.  Neurological:  Positive for dizziness. Negative for numbness and weakness.       Related to sinuses  Hematological:  Negative for swollen glands.  Psychiatric/Behavioral:  Positive for sleep disturbance. Negative for depressed mood. The patient is nervous/anxious.     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   History of psoriasis 05/20/2019   History of sarcoidosis 05/20/2019   Allergic rhinitis 05/20/2019   Sciatica of left side 05/20/2019   Arthralgia 05/20/2019   Elevated LDL cholesterol level 01/07/2018   Healthcare maintenance 01/07/2018    Past Medical History:  Diagnosis Date   Allergy    Arthritis    Chicken pox    Depression    Emphysema of lung ([facility])    when young   Frequent headaches    GERD (gastroesophageal reflux disease)    Hyperlipidemia    Migraine    Urinary tract infection     Family History  Problem Relation Age of Onset   Alcohol abuse Mother    Arthritis Mother  Depression Mother    Hyperlipidemia Mother    Hypertension Mother    Kidney disease Mother    Alcohol abuse Father    Early death Father    Aneurysm Father    Depression Sister    Diabetes Sister    Heart attack Sister    Hyperlipidemia Sister    Hypertension Sister    79 / [place] Sister    Stroke Sister    Healthy Daughter    Heart attack Maternal Grandfather    Hypertension Maternal Grandfather    Hyperlipidemia Maternal Grandfather    Arthritis Sister    Past Surgical History:  Procedure Laterality Date    ABDOMINAL HYSTERECTOMY  2002   partial   BUNIONECTOMY Left 06/1996   Social History   Social History Narrative   Not on file   Immunization History  Administered Date(s) Administered   Influenza,inj,Quad PF,6+ Mos 10/09/2021   PFIZER Comirnaty(Gray Top)Covid-19 Tri-Sucrose Vaccine 09/03/2020, 03/30/2021   PFIZER(Purple Top)SARS-COV-2 Vaccination 02/04/2020, 02/28/2020, 08/24/2021     Objective: Vital Signs: BP 131/83 (BP Location: Left Arm, Patient Position: Sitting, Cuff Size: Small)   Pulse 71   Resp 14   Ht 5' 5.5" (1.664 m)   Wt 154 lb 12.8 oz (70.2 kg)   BMI 25.37 kg/m    Physical Exam Vitals and nursing note reviewed.  Constitutional:      Appearance: She is well-developed.  HENT:     Head: Normocephalic and atraumatic.  Eyes:     Conjunctiva/sclera: Conjunctivae normal.  Cardiovascular:     Rate and Rhythm: Normal rate and regular rhythm.     Heart sounds: Normal heart sounds.  Pulmonary:     Effort: Pulmonary effort is normal.     Breath sounds: Normal breath sounds.  Abdominal:     General: Bowel sounds are normal.     Palpations: Abdomen is soft.  Musculoskeletal:     Cervical back: Normal range of motion.  Lymphadenopathy:     Cervical: No cervical adenopathy.  Skin:    General: Skin is warm and dry.     Capillary Refill: Capillary refill takes less than 2 seconds.  Neurological:     Mental Status: She is alert and oriented to person, place, and time.  Psychiatric:        Behavior: Behavior normal.      Musculoskeletal Exam: C-spine, thoracic and lumbar spine were in good range of motion.  Shoulder joints, elbows, wrist joints were in good range of motion.  She had bilateral CMC PIP and DIP thickening with tenderness over her CMC joints.  Hip joints and knee joints were in good range of motion.  No warmth swelling or effusion was noted.  She had no tenderness over ankles or MTPs.  CDAI Exam: CDAI Score: -- Patient Global: --; Provider Global:  -- Swollen: --; Tender: -- Joint Exam 07/05/2022   No joint exam has been documented for this visit   There is currently no information documented on the homunculus. Go to the Rheumatology activity and complete the homunculus joint exam.  Investigation: No additional findings.  Imaging: No results found.  Recent Labs: Lab Results  Component Value Date   WBC 6.6 02/12/2022   HGB 13.9 02/12/2022   PLT 256.0 02/12/2022   NA 139 06/03/2022   K 4.2 06/03/2022   CL 105 06/03/2022   CO2 27 06/03/2022   GLUCOSE 91 06/03/2022   BUN 15 06/03/2022   CREATININE 0.76 06/03/2022   BILITOT 0.4 06/03/2022  ALKPHOS 67 06/03/2022   AST 18 06/03/2022   ALT 16 06/03/2022   PROT 6.9 06/03/2022   ALBUMIN 4.2 06/03/2022   CALCIUM 9.3 06/03/2022    Speciality Comments: No specialty comments available.  Procedures:  No procedures performed Allergies: Prednisone, Codeine, and Latex   Assessment / Plan:     Visit Diagnoses: Primary osteoarthritis of both hands - Clinical and radiographic findings are consistent with osteoarthritis.  Patient had x-ray of her left hand on June 03, 2022 which was reviewed.  She has severe CMC narrowing.  She continues to have discomfort in her CMC joints and PIP and DIP joints.  Joint protection muscle strengthening was discussed.  A handout on left [facility] prescription was given.  Patient declined physical therapy.  Chondromalacia of both patellae -she complains of discomfort in her knee joints off-and-on.  She also feels that her lower extremity muscles are not as strong.  Quad muscle strengthening exercises were demonstrated in the office.  A handout on lower extremity exercises was given.  Primary osteoarthritis of both feet -proper fitting shoes and use of orthotics was advised.  Chronic SI joint pain -she continues to have some discomfort in her SI joints.  She has pain  after walking her dog.  X-rays were unremarkable except for some degenerative changes.   Stretching exercises were emphasized.  DDD (degenerative disc disease), lumbar - History of lower back pain.  X-rays showed mild dextroscoliosis and significant disc disease.  Back exercises were demonstrated.  Postural kyphosis of thoracic region-exercises were demonstrated in the office.  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  Osteopenia of multiple sites - The BMD measured at Femur Neck Left is 0.801 g/cm2 with a T-score of-1.7.  DEXA findings were reviewed with the patient.  Use of calcium rich diet and vitamin D was discussed.  Patient states she has been taking vitamin D supplement.  History of sarcoidosis - She has not had any flares in a long time.  She denies any shortness of breath.  She had recently had x-ray of her rib cage which was unremarkable.  Psoriasis -she has intermittent dry patches.  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  Elevated LDL cholesterol level  Thoracic outlet syndrome  Orders: No orders of the defined types were placed in this encounter.  No orders of the defined types were placed in this encounter.    Follow-Up Instructions: Return in about 6 months (around 01/05/2023) for Osteoarthritis.   Bo Merino, MD  Note - This record has been created using Editor, commissioning.  Chart creation errors have been sought, but may not always  have been located. Such creation errors do not reflect on  the standard of medical care.

## 2022-06-26 ENCOUNTER — Other Ambulatory Visit: Payer: Self-pay | Admitting: Family Medicine

## 2022-06-26 DIAGNOSIS — I10 Essential (primary) hypertension: Secondary | ICD-10-CM

## 2022-06-29 ENCOUNTER — Encounter: Payer: Self-pay | Admitting: Rheumatology

## 2022-07-05 ENCOUNTER — Encounter: Payer: Self-pay | Admitting: Rheumatology

## 2022-07-05 ENCOUNTER — Ambulatory Visit: Payer: PRIVATE HEALTH INSURANCE | Attending: Rheumatology | Admitting: Rheumatology

## 2022-07-05 VITALS — BP 131/83 | HR 71 | Resp 14 | Ht 65.5 in | Wt 154.8 lb

## 2022-07-05 DIAGNOSIS — M19041 Primary osteoarthritis, right hand: Secondary | ICD-10-CM

## 2022-07-05 DIAGNOSIS — M533 Sacrococcygeal disorders, not elsewhere classified: Secondary | ICD-10-CM

## 2022-07-05 DIAGNOSIS — E7800 Pure hypercholesterolemia, unspecified: Secondary | ICD-10-CM

## 2022-07-05 DIAGNOSIS — M19071 Primary osteoarthritis, right ankle and foot: Secondary | ICD-10-CM | POA: Diagnosis not present

## 2022-07-05 DIAGNOSIS — M26609 Unspecified temporomandibular joint disorder, unspecified side: Secondary | ICD-10-CM

## 2022-07-05 DIAGNOSIS — M5136 Other intervertebral disc degeneration, lumbar region: Secondary | ICD-10-CM

## 2022-07-05 DIAGNOSIS — G540 Brachial plexus disorders: Secondary | ICD-10-CM

## 2022-07-05 DIAGNOSIS — M2242 Chondromalacia patellae, left knee: Secondary | ICD-10-CM

## 2022-07-05 DIAGNOSIS — G8929 Other chronic pain: Secondary | ICD-10-CM

## 2022-07-05 DIAGNOSIS — Z862 Personal history of diseases of the blood and blood-forming organs and certain disorders involving the immune mechanism: Secondary | ICD-10-CM

## 2022-07-05 DIAGNOSIS — M4004 Postural kyphosis, thoracic region: Secondary | ICD-10-CM

## 2022-07-05 DIAGNOSIS — L409 Psoriasis, unspecified: Secondary | ICD-10-CM

## 2022-07-05 DIAGNOSIS — M19072 Primary osteoarthritis, left ankle and foot: Secondary | ICD-10-CM

## 2022-07-05 DIAGNOSIS — M8589 Other specified disorders of bone density and structure, multiple sites: Secondary | ICD-10-CM

## 2022-07-05 DIAGNOSIS — L920 Granuloma annulare: Secondary | ICD-10-CM

## 2022-07-05 DIAGNOSIS — M2241 Chondromalacia patellae, right knee: Secondary | ICD-10-CM | POA: Diagnosis not present

## 2022-07-05 DIAGNOSIS — M19042 Primary osteoarthritis, left hand: Secondary | ICD-10-CM

## 2022-07-05 NOTE — Patient Instructions (Signed)
Back Exercises The following exercises strengthen the muscles that help to support the trunk (torso) and back. They also help to keep the lower back flexible. Doing these exercises can help to prevent or lessen existing low back pain. If you have back pain or discomfort, try doing these exercises 2-3 times each day or as told by your health care provider. As your pain improves, do them once each day, but increase the number of times that you repeat the steps for each exercise (do more repetitions). To prevent the recurrence of back pain, continue to do these exercises once each day or as told by your health care provider. Do exercises exactly as told by your health care provider and adjust them as directed. It is normal to feel mild stretching, pulling, tightness, or discomfort as you do these exercises, but you should stop right away if you feel sudden pain or your pain gets worse. Exercises Single knee to chest Repeat these steps 3-5 times for each leg: Lie on your back on a firm bed or the floor with your legs extended. Bring one knee to your chest. Your other leg should stay extended and in contact with the floor. Hold your knee in place by grabbing your knee or thigh with both hands and hold. Pull on your knee until you feel a gentle stretch in your lower back or buttocks. Hold the stretch for 10-30 seconds. Slowly release and straighten your leg.  Pelvic tilt Repeat these steps 5-10 times: Lie on your back on a firm bed or the floor with your legs extended. Bend your knees so they are pointing toward the ceiling and your feet are flat on the floor. Tighten your lower abdominal muscles to press your lower back against the floor. This motion will tilt your pelvis so your tailbone points up toward the ceiling instead of pointing to your feet or the floor. With gentle tension and even breathing, hold this position for 5-10 seconds.  Cat-cow Repeat these steps until your lower back becomes  more flexible: Get into a hands-and-knees position on a firm bed or the floor. Keep your hands under your shoulders, and keep your knees under your hips. You may place padding under your knees for comfort. Let your head hang down toward your chest. Contract your abdominal muscles and point your tailbone toward the floor so your lower back becomes rounded like the back of a cat. Hold this position for 5 seconds. Slowly lift your head, let your abdominal muscles relax, and point your tailbone up toward the ceiling so your back forms a sagging arch like the back of a cow. Hold this position for 5 seconds.  Press-ups Repeat these steps 5-10 times: Lie on your abdomen (face-down) on a firm bed or the floor. Place your palms near your head, about shoulder-width apart. Keeping your back as relaxed as possible and keeping your hips on the floor, slowly straighten your arms to raise the top half of your body and lift your shoulders. Do not use your back muscles to raise your upper torso. You may adjust the placement of your hands to make yourself more comfortable. Hold this position for 5 seconds while you keep your back relaxed. Slowly return to lying flat on the floor.  Bridges Repeat these steps 10 times: Lie on your back on a firm bed or the floor. Bend your knees so they are pointing toward the ceiling and your feet are flat on the floor. Your arms should be flat   at your sides, next to your body. Tighten your buttocks muscles and lift your buttocks off the floor until your waist is at almost the same height as your knees. You should feel the muscles working in your buttocks and the back of your thighs. If you do not feel these muscles, slide your feet 1-2 inches (2.5-5 cm) farther away from your buttocks. Hold this position for 3-5 seconds. Slowly lower your hips to the starting position, and allow your buttocks muscles to relax completely. If this exercise is too easy, try doing it with your arms  crossed over your chest. Abdominal crunches Repeat these steps 5-10 times: Lie on your back on a firm bed or the floor with your legs extended. Bend your knees so they are pointing toward the ceiling and your feet are flat on the floor. Cross your arms over your chest. Tip your chin slightly toward your chest without bending your neck. Tighten your abdominal muscles and slowly raise your torso high enough to lift your shoulder blades a tiny bit off the floor. Avoid raising your torso higher than that because it can put too much stress on your lower back and does not help to strengthen your abdominal muscles. Slowly return to your starting position.  Back lifts Repeat these steps 5-10 times: Lie on your abdomen (face-down) with your arms at your sides, and rest your forehead on the floor. Tighten the muscles in your legs and your buttocks. Slowly lift your chest off the floor while you keep your hips pressed to the floor. Keep the back of your head in line with the curve in your back. Your eyes should be looking at the floor. Hold this position for 3-5 seconds. Slowly return to your starting position.  Contact a health care provider if: Your back pain or discomfort gets much worse when you do an exercise. Your worsening back pain or discomfort does not lessen within 2 hours after you exercise. If you have any of these problems, stop doing these exercises right away. Do not do them again unless your health care provider says that you can. Get help right away if: You develop sudden, severe back pain. If this happens, stop doing the exercises right away. Do not do them again unless your health care provider says that you can. This information is not intended to replace advice given to you by your health care provider. Make sure you discuss any questions you have with your health care provider. Document Revised: 05/08/2021 Document Reviewed: 01/24/2021 [employer] Patient Education  2023 [employer] Exercises for Chronic Knee Pain Chronic knee pain is pain that lasts longer than 3 months. For most people with chronic knee pain, exercise and weight loss is an important part of treatment. Your health care provider may want you to focus on: Strengthening the muscles that support your knee. This can take pressure off your knee and lessen pain. Preventing knee stiffness. Maintaining or increasing how far you can move your knee. Losing weight (if this applies) to take pressure off your knee, decrease your risk for injury, and make it easier for you to exercise. Your health care provider will help you develop an exercise program that matches your needs and physical abilities. Below are simple, low-impact exercises you can do at home. Ask your health care provider or a physical therapist how often you should do your exercise program and how many times to repeat each exercise. General safety tips Follow these safety tips for exercising with chronic   knee pain: Get your health care provider's approval before doing any exercises. Start slowly and stop any time an exercise causes pain. Do not exercise if your knee pain is flaring up. Warm up first. Stretching a cold muscle can cause an injury. Do 5-10 minutes of easy movement or light stretching before beginning your exercise routine. Do 5-10 minutes of low-impact activity (like walking or cycling) before starting strengthening exercises. Contact your health care provider any time you have pain during or after exercising. Exercise may cause discomfort but should not be painful. It is normal to be a little stiff or sore after exercising.  Stretching and range-of-motion exercises Front thigh stretch  Stand up straight and support your body by holding on to a chair or resting one hand on a wall. With your legs straight and close together, bend one knee to lift your heel up toward your buttocks. Using one hand for support, grab your ankle with your  free hand. Pull your foot up closer toward your buttocks to feel the stretch in front of your thigh. Hold the stretch for 30 seconds. Repeat __________ times. Complete this exercise __________ times a day. Back thigh stretch  Sit on the floor with your back straight and your legs out straight in front of you. Place the palms of your hands on the floor and slide them toward your feet as you bend at the hip. Try to touch your nose to your knees and feel the stretch in the back of your thighs. Hold for 30 seconds. Repeat __________ times. Complete this exercise __________ times a day. Calf stretch  Stand facing a wall. Place the palms of your hands flat against the wall, arms extended, and lean slightly against the wall. Get into a lunge position with one leg bent at the knee and the other leg stretched out straight behind you. Keep both feet facing the wall and increase the bend in your knee while keeping the heel of the other leg flat on the ground. You should feel the stretch in your calf. Hold for 30 seconds. Repeat __________ times. Complete this exercise __________ times a day. Strengthening exercises Straight leg lift Lie on your back with one knee bent and the other leg out straight. Slowly lift the straight leg without bending the knee. Lift until your foot is about 12 inches (30 cm) off the floor. Hold for 3-5 seconds and slowly lower your leg. Repeat __________ times. Complete this exercise __________ times a day. Single leg dip Stand between two chairs and put both hands on the backs of the chairs for support. Extend one leg out straight with your body weight resting on the heel of the standing leg. Slowly bend your standing knee to dip your body to the level that is comfortable for you. Hold for 3-5 seconds. Repeat __________ times. Complete this exercise __________ times a day. Hamstring curls Stand straight, knees close together, facing the back of a chair. Hold on to  the back of a chair with both hands. Keep one leg straight. Bend the other knee while bringing the heel up toward the buttock until the knee is bent at a 90-degree angle (right angle). Hold for 3-5 seconds. Repeat __________ times. Complete this exercise __________ times a day. Wall squat Stand straight with your back, hips, and head against a wall. Step forward one foot at a time with your back still against the wall. Your feet should be 2 feet (61 cm) from the wall at shoulder width.   Keeping your back, hips, and head against the wall, slide down the wall to as close of a sitting position as you can get. Hold for 5-10 seconds, then slowly slide back up. Repeat __________ times. Complete this exercise __________ times a day. Step-ups Step up with one foot onto a sturdy platform or stool that is about 6 inches (15 cm) high. Face sideways with one foot on the platform and one on the ground. Place all your weight on the platform foot and lift your body off the ground until your knee extends. Let your other leg hang free to the side. Hold for 3-5 seconds then slowly lower your weight down to the floor foot. Repeat __________ times. Complete this exercise __________ times a day. Contact a health care provider if: Your exercise causes pain. Your pain is worse after you exercise. Your pain prevents you from doing your exercises. This information is not intended to replace advice given to you by your health care provider. Make sure you discuss any questions you have with your health care provider. Document Revised: 03/16/2020 Document Reviewed: 11/08/2019 Elsevier Patient Education  2023 [employer]  

## 2022-07-30 ENCOUNTER — Encounter: Payer: Self-pay | Admitting: Family Medicine

## 2022-07-31 ENCOUNTER — Encounter: Payer: Self-pay | Admitting: Rheumatology

## 2022-12-04 ENCOUNTER — Encounter: Payer: Self-pay | Admitting: Family Medicine

## 2022-12-04 ENCOUNTER — Ambulatory Visit (INDEPENDENT_AMBULATORY_CARE_PROVIDER_SITE_OTHER): Payer: PRIVATE HEALTH INSURANCE | Admitting: Family Medicine

## 2022-12-04 VITALS — BP 162/94 | HR 81 | Temp 98.0°F | Ht 65.0 in | Wt 156.8 lb

## 2022-12-04 DIAGNOSIS — I10 Essential (primary) hypertension: Secondary | ICD-10-CM | POA: Diagnosis not present

## 2022-12-04 DIAGNOSIS — B309 Viral conjunctivitis, unspecified: Secondary | ICD-10-CM | POA: Diagnosis not present

## 2022-12-04 DIAGNOSIS — H6993 Unspecified Eustachian tube disorder, bilateral: Secondary | ICD-10-CM | POA: Diagnosis not present

## 2022-12-04 DIAGNOSIS — J0110 Acute frontal sinusitis, unspecified: Secondary | ICD-10-CM

## 2022-12-04 MED ORDER — AZITHROMYCIN 250 MG PO TABS: ORAL_TABLET | ORAL | 0 refills | Status: AC

## 2022-12-04 NOTE — Progress Notes (Signed)
Established Patient Office Visit   Subjective:  Patient ID: Tara Avery, female    DOB: 05-07-1961  Age: 62 y.o. MRN: [id number]  Chief Complaint  Patient presents with   URI    Cough, runny nose eyes a little runny symptoms x 10 days.     URI  Associated symptoms include congestion, coughing and sinus pain. Pertinent negatives include no abdominal pain, rash or wheezing.   Encounter Diagnoses  Name Primary?   Essential hypertension Yes   Dysfunction of both eustachian tubes    Acute viral conjunctivitis of left eye    Acute non-recurrent frontal sinusitis    Presents with a 10-day history of URI signs and symptoms.  She is now feeling some pressure in her frontal sinus area.  Rhinorrhea has been clear.  Her ears are congested bilaterally.  Minimal cough without reactive airway disease.  There is been no fever or chills.  Developed conjunctivitis in right eye that spontaneously cleared.  Currently started experiencing injection of the left eye watery discharge.  Vision is not affected.  Prednisone causes palpitations.  She is compliant with her losartan.   Review of Systems  Constitutional: Negative.   HENT:  Positive for congestion, ear discharge and sinus pain. Negative for nosebleeds.   Eyes:  Positive for discharge and redness. Negative for blurred vision.  Respiratory:  Positive for cough. Negative for sputum production, shortness of breath and wheezing.   Cardiovascular: Negative.   Gastrointestinal:  Negative for abdominal pain.  Genitourinary: Negative.   Musculoskeletal: Negative.  Negative for myalgias.  Skin:  Negative for rash.  Neurological:  Negative for tingling, loss of consciousness and weakness.  Endo/Heme/Allergies:  Negative for polydipsia.     Current Outpatient Medications:    atorvastatin (LIPITOR) 10 MG tablet, TAKE 1 TABLET BY MOUTH EVERY DAY, Disp: 90 tablet, Rfl: 3   azithromycin (ZITHROMAX) 250 MG tablet, Take 2 tablets on day 1, then 1  tablet daily on days 2 through 5, Disp: 6 tablet, Rfl: 0   calcium carbonate (TUMS - DOSED IN MG ELEMENTAL CALCIUM) 500 MG chewable tablet, Chew 1 tablet by mouth daily., Disp: , Rfl:    cetirizine (ZYRTEC) 10 MG tablet, Take 10 mg by mouth daily., Disp: , Rfl:    Cholecalciferol (VITAMIN D3) 2000 units TABS, Take 1 tablet by mouth daily., Disp: , Rfl:    diphenhydrAMINE (BENADRYL) 25 MG tablet, Take 25 mg by mouth as needed., Disp: , Rfl:    ibuprofen (ADVIL,MOTRIN) 200 MG tablet, Take 200 mg by mouth as needed., Disp: , Rfl:    losartan (COZAAR) 50 MG tablet, TAKE 1 TABLET BY MOUTH EVERY DAY, Disp: 90 tablet, Rfl: 2   MELATONIN ER PO, Take by mouth., Disp: , Rfl:    Multiple Vitamins-Minerals (CENTRUM WOMEN PO), Take 0.5 tablets by mouth daily., Disp: , Rfl:    Simethicone (GAS-X PO), Take 1 tablet by mouth as needed., Disp: , Rfl:    Objective:     BP (!) 162/94 (BP Location: Right Arm, Patient Position: Sitting, Cuff Size: Normal)   Pulse 81   Temp 98 F (36.7 C) (Temporal)   Ht '5\' 5"'$  (1.651 m)   Wt 156 lb 12.8 oz (71.1 kg)   SpO2 97%   BMI 26.09 kg/m  BP Readings from Last 3 Encounters:  12/04/22 (!) 162/94  07/05/22 131/83  06/03/22 138/78   Wt Readings from Last 3 Encounters:  12/04/22 156 lb 12.8 oz (71.1 kg)  07/05/22 154 lb  12.8 oz (70.2 kg)  06/03/22 155 lb 12.8 oz (70.7 kg)      Physical Exam Constitutional:      General: She is not in acute distress.    Appearance: Normal appearance. She is not ill-appearing, toxic-appearing or diaphoretic.  HENT:     Head: Normocephalic and atraumatic.     Right Ear: Tympanic membrane, ear canal and external ear normal.     Left Ear: Tympanic membrane, ear canal and external ear normal.     Ears:     Comments: Ear congestion was not cleared with Valsalva.    Mouth/Throat:     Mouth: Mucous membranes are moist.     Pharynx: Oropharynx is clear. No oropharyngeal exudate or posterior oropharyngeal erythema.  Eyes:      General: No scleral icterus.       Right eye: No discharge.        Left eye: No discharge.     Extraocular Movements: Extraocular movements intact.     Conjunctiva/sclera: Conjunctivae normal.     Pupils: Pupils are equal, round, and reactive to light.  Cardiovascular:     Rate and Rhythm: Normal rate and regular rhythm.  Pulmonary:     Effort: Pulmonary effort is normal. No respiratory distress.     Breath sounds: Normal breath sounds. No wheezing or rales.  Abdominal:     General: Bowel sounds are normal.     Tenderness: There is no abdominal tenderness. There is no guarding.  Musculoskeletal:     Cervical back: No rigidity or tenderness.  Skin:    General: Skin is warm and dry.  Neurological:     Mental Status: She is alert and oriented to person, place, and time.  Psychiatric:        Mood and Affect: Mood normal.        Behavior: Behavior normal.      No results found for any visits on 12/04/22.    The 10-year ASCVD risk score (Arnett DK, et al., 2019) is: 8.5%    Assessment & Plan:   Essential hypertension  Dysfunction of both eustachian tubes  Acute viral conjunctivitis of left eye  Acute non-recurrent frontal sinusitis -     Azithromycin; Take 2 tablets on day 1, then 1 tablet daily on days 2 through 5  Dispense: 6 tablet; Refill: 0    Return in about 7 weeks (around 01/22/2023), or Check and record blood pressures periodically.  Will let me know if left eye does not clear by the end of the week.  Using Zithromax mostly for its anti-inflammatory properties.  Patient does not tolerate prednisone.  Will switch to Mucinex from Benadryl.  Information was given on viral conjunctivitis and eustachian tube dysfunction.  Libby Maw, MD

## 2022-12-09 ENCOUNTER — Ambulatory Visit: Payer: PRIVATE HEALTH INSURANCE | Admitting: Family Medicine

## 2023-01-22 ENCOUNTER — Ambulatory Visit (INDEPENDENT_AMBULATORY_CARE_PROVIDER_SITE_OTHER): Payer: PRIVATE HEALTH INSURANCE | Admitting: Family Medicine

## 2023-01-22 ENCOUNTER — Encounter: Payer: Self-pay | Admitting: Family Medicine

## 2023-01-22 VITALS — BP 140/82 | HR 77 | Temp 98.7°F | Ht 65.0 in | Wt 159.2 lb

## 2023-01-22 DIAGNOSIS — N951 Menopausal and female climacteric states: Secondary | ICD-10-CM

## 2023-01-22 DIAGNOSIS — I10 Essential (primary) hypertension: Secondary | ICD-10-CM

## 2023-01-22 DIAGNOSIS — J309 Allergic rhinitis, unspecified: Secondary | ICD-10-CM | POA: Diagnosis not present

## 2023-01-22 MED ORDER — LOSARTAN POTASSIUM 100 MG PO TABS: 100.0000 mg | ORAL_TABLET | Freq: Every day | ORAL | 1 refills | Status: DC

## 2023-01-22 MED ORDER — PREMARIN 0.625 MG/GM VA CREA: 1.0000 | TOPICAL_CREAM | Freq: Every day | VAGINAL | 12 refills | Status: DC

## 2023-01-22 NOTE — Progress Notes (Signed)
Established Patient Office Visit   Subjective:  Patient ID: Tara Avery, female    DOB: [birth year]  Age: 62 y.o. MRN: [id number]  Chief Complaint  Patient presents with   Medical Management of Chronic Issues    Follow up on BP recheck ears. Patient has concerns about continued hot flashes and fatigue becoming worse. Left eye became red again few weeks ago not sure if this is allergies.     HPI Encounter Diagnoses  Name Primary?   Essential hypertension Yes   Vaginal dryness, menopausal    Allergic rhinitis, unspecified seasonality, unspecified trigger    Nasal congestion postnasal drip itchy eyes and ears with sneezing persists.  Flonase plan to epistaxes.  She continues with salt water nose spray with some relief.  Ongoing issues with dry eyes.  Ongoing vaginal dryness with some pruritus.  She has tried systemic hormone replacement but did not tolerate it.  She believes that vaginal estrogen led to a yeast infection.  Up-to-date on mammograms.   Review of Systems  Constitutional: Negative.   HENT:  Positive for congestion. Negative for sore throat.   Eyes:  Negative for blurred vision, discharge and redness.  Respiratory: Negative.    Cardiovascular: Negative.   Gastrointestinal:  Negative for abdominal pain.  Genitourinary: Negative.   Musculoskeletal: Negative.  Negative for myalgias.  Skin:  Negative for rash.  Neurological:  Negative for tingling, loss of consciousness and weakness.  Endo/Heme/Allergies:  Negative for polydipsia.     Current Outpatient Medications:    atorvastatin (LIPITOR) 10 MG tablet, TAKE 1 TABLET BY MOUTH EVERY DAY, Disp: 90 tablet, Rfl: 3   calcium carbonate (TUMS - DOSED IN MG ELEMENTAL CALCIUM) 500 MG chewable tablet, Chew 1 tablet by mouth daily., Disp: , Rfl:    cetirizine (ZYRTEC) 10 MG tablet, Take 10 mg by mouth daily., Disp: , Rfl:    Cholecalciferol (VITAMIN D3) 2000 units TABS, Take 1 tablet by mouth daily., Disp: , Rfl:     conjugated estrogens (PREMARIN) vaginal cream, Place 1 Applicatorful vaginally daily., Disp: 42.5 g, Rfl: 12   diphenhydrAMINE (BENADRYL) 25 MG tablet, Take 25 mg by mouth as needed., Disp: , Rfl:    ibuprofen (ADVIL,MOTRIN) 200 MG tablet, Take 200 mg by mouth as needed., Disp: , Rfl:    losartan (COZAAR) 100 MG tablet, Take 1 tablet (100 mg total) by mouth daily., Disp: 90 tablet, Rfl: 1   MELATONIN ER PO, Take by mouth., Disp: , Rfl:    Multiple Vitamins-Minerals (CENTRUM WOMEN PO), Take 0.5 tablets by mouth daily., Disp: , Rfl:    Simethicone (GAS-X PO), Take 1 tablet by mouth as needed., Disp: , Rfl:    Objective:     BP (!) 140/82 (BP Location: Left Arm, Patient Position: Sitting, Cuff Size: Normal)   Pulse 77   Temp 98.7 F (37.1 C) (Temporal)   Ht '5\' 5"'$  (1.651 m)   Wt 159 lb 3.2 oz (72.2 kg)   SpO2 97%   BMI 26.49 kg/m  BP Readings from Last 3 Encounters:  01/22/23 (!) 140/82  12/04/22 (!) 162/94  07/05/22 131/83   Wt Readings from Last 3 Encounters:  01/22/23 159 lb 3.2 oz (72.2 kg)  12/04/22 156 lb 12.8 oz (71.1 kg)  07/05/22 154 lb 12.8 oz (70.2 kg)      Physical Exam Constitutional:      General: She is not in acute distress.    Appearance: Normal appearance. She is not ill-appearing, toxic-appearing or diaphoretic.  HENT:     Head: Normocephalic and atraumatic.     Right Ear: Tympanic membrane, ear canal and external ear normal.     Left Ear: Tympanic membrane, ear canal and external ear normal.  Eyes:     General: No scleral icterus.       Right eye: No discharge.        Left eye: No discharge.     Extraocular Movements: Extraocular movements intact.     Conjunctiva/sclera: Conjunctivae normal.     Pupils: Pupils are equal, round, and reactive to light.  Cardiovascular:     Rate and Rhythm: Normal rate and regular rhythm.  Pulmonary:     Effort: Pulmonary effort is normal. No respiratory distress.     Breath sounds: Normal breath sounds.   Musculoskeletal:     Cervical back: No rigidity or tenderness.  Skin:    General: Skin is warm and dry.  Neurological:     Mental Status: She is alert and oriented to person, place, and time.  Psychiatric:        Mood and Affect: Mood normal.        Behavior: Behavior normal.      No results found for any visits on 01/22/23.    The 10-year ASCVD risk score (Arnett DK, et al., 2019) is: 6.4%    Assessment & Plan:   Essential hypertension -     Losartan Potassium; Take 1 tablet (100 mg total) by mouth daily.  Dispense: 90 tablet; Refill: 1  Vaginal dryness, menopausal -     Premarin; Place 1 Applicatorful vaginally daily.  Dispense: 42.5 g; Refill: 12  Allergic rhinitis, unspecified seasonality, unspecified trigger    Return in about 4 weeks (around 02/19/2023).  Recommended follow-up with eye doctor for dry eyes.  Could try Flonase every other day to avoid epistaxis.  Continue salt water nose spray.  Libby Maw, MD

## 2023-02-20 ENCOUNTER — Ambulatory Visit (INDEPENDENT_AMBULATORY_CARE_PROVIDER_SITE_OTHER): Payer: PRIVATE HEALTH INSURANCE | Admitting: Family Medicine

## 2023-02-20 ENCOUNTER — Encounter: Payer: Self-pay | Admitting: Family Medicine

## 2023-02-20 VITALS — BP 150/80 | HR 75 | Temp 97.8°F | Ht 65.0 in | Wt 159.4 lb

## 2023-02-20 DIAGNOSIS — I10 Essential (primary) hypertension: Secondary | ICD-10-CM

## 2023-02-20 DIAGNOSIS — N951 Menopausal and female climacteric states: Secondary | ICD-10-CM | POA: Insufficient documentation

## 2023-02-20 NOTE — Progress Notes (Signed)
Established Patient Office Visit   Subjective:  Patient ID: Tara Avery, female    DOB: 09/03/1961  Age: 62 y.o. MRN: [id number]  Chief Complaint  Patient presents with   Hypertension    Hypertension Pertinent negatives include no blurred vision.   Encounter Diagnoses  Name Primary?   Essential hypertension Yes   Hot flashes, menopausal    Doing well with losartan.  Continues to experience hot flashes 2-3 times daily.   Review of Systems  Constitutional:  Positive for diaphoresis.  HENT: Negative.    Eyes:  Negative for blurred vision, discharge and redness.  Respiratory: Negative.    Cardiovascular: Negative.   Gastrointestinal:  Negative for abdominal pain.  Genitourinary: Negative.   Musculoskeletal: Negative.  Negative for myalgias.  Skin:  Negative for rash.  Neurological:  Negative for tingling, loss of consciousness and weakness.  Endo/Heme/Allergies:  Negative for polydipsia.     Current Outpatient Medications:    atorvastatin (LIPITOR) 10 MG tablet, TAKE 1 TABLET BY MOUTH EVERY DAY, Disp: 90 tablet, Rfl: 3   calcium carbonate (TUMS - DOSED IN MG ELEMENTAL CALCIUM) 500 MG chewable tablet, Chew 1 tablet by mouth daily., Disp: , Rfl:    cetirizine (ZYRTEC) 10 MG tablet, Take 10 mg by mouth daily., Disp: , Rfl:    Cholecalciferol (VITAMIN D3) 2000 units TABS, Take 1 tablet by mouth daily., Disp: , Rfl:    conjugated estrogens (PREMARIN) vaginal cream, Place 1 Applicatorful vaginally daily., Disp: 42.5 g, Rfl: 12   diphenhydrAMINE (BENADRYL) 25 MG tablet, Take 25 mg by mouth as needed., Disp: , Rfl:    ibuprofen (ADVIL,MOTRIN) 200 MG tablet, Take 200 mg by mouth as needed., Disp: , Rfl:    losartan (COZAAR) 100 MG tablet, Take 1 tablet (100 mg total) by mouth daily., Disp: 90 tablet, Rfl: 1   MELATONIN ER PO, Take by mouth., Disp: , Rfl:    Multiple Vitamins-Minerals (CENTRUM WOMEN PO), Take 0.5 tablets by mouth daily., Disp: , Rfl:    Simethicone (GAS-X PO),  Take 1 tablet by mouth as needed., Disp: , Rfl:    Objective:     BP (!) 150/80   Pulse 75   Temp 97.8 F (36.6 C)   Ht 5\' 5"  (1.651 m)   Wt 159 lb 6.4 oz (72.3 kg)   SpO2 96%   BMI 26.53 kg/m  BP Readings from Last 3 Encounters:  02/20/23 (!) 150/80  01/22/23 (!) 140/82  12/04/22 (!) 162/94   Wt Readings from Last 3 Encounters:  02/20/23 159 lb 6.4 oz (72.3 kg)  01/22/23 159 lb 3.2 oz (72.2 kg)  12/04/22 156 lb 12.8 oz (71.1 kg)      Physical Exam Constitutional:      General: She is not in acute distress.    Appearance: Normal appearance. She is not ill-appearing, toxic-appearing or diaphoretic.  HENT:     Head: Normocephalic and atraumatic.     Right Ear: External ear normal.     Left Ear: External ear normal.     Mouth/Throat:     Mouth: Mucous membranes are moist.     Pharynx: Oropharynx is clear. No oropharyngeal exudate or posterior oropharyngeal erythema.  Eyes:     General: No scleral icterus.       Right eye: No discharge.        Left eye: No discharge.     Extraocular Movements: Extraocular movements intact.     Conjunctiva/sclera: Conjunctivae normal.     Pupils:  Pupils are equal, round, and reactive to light.  Cardiovascular:     Rate and Rhythm: Normal rate and regular rhythm.  Pulmonary:     Effort: Pulmonary effort is normal. No respiratory distress.     Breath sounds: Normal breath sounds.  Musculoskeletal:     Cervical back: No rigidity or tenderness.  Skin:    General: Skin is warm and dry.  Neurological:     Mental Status: She is alert and oriented to person, place, and time.  Psychiatric:        Mood and Affect: Mood normal.        Behavior: Behavior normal.      No results found for any visits on 02/20/23.    The 10-year ASCVD risk score (Arnett DK, et al., 2019) is: 7.3%    Assessment & Plan:   Essential hypertension -     Basic metabolic panel  Hot flashes, menopausal  Discussed adding a second medicine such as a  diuretic or possibly clonidine to help with her hot flashes.  Patient would like to hold off for now.  She will double down on her efforts to exercise lose weight and avoid sodium  Return in about 8 weeks (around 04/17/2023), or Bring your BP cuff next visit.Libby Maw, MD

## 2023-02-21 LAB — BASIC METABOLIC PANEL
BUN: 16 mg/dL (ref 7–25)
CO2: 25 mmol/L (ref 20–32)
Calcium: 9.4 mg/dL (ref 8.6–10.4)
Chloride: 107 mmol/L (ref 98–110)
Creat: 0.85 mg/dL (ref 0.50–1.05)
Glucose, Bld: 85 mg/dL (ref 65–99)
Potassium: 5 mmol/L (ref 3.5–5.3)
Sodium: 144 mmol/L (ref 135–146)

## 2023-03-27 ENCOUNTER — Telehealth: Payer: Self-pay | Admitting: Family Medicine

## 2023-03-27 NOTE — Telephone Encounter (Signed)
Pt called and said she spoke with her old gyn dr is leaving the practice and her next available appt. With her new gyn will be 2 months .what should you suggest

## 2023-03-27 NOTE — Telephone Encounter (Signed)
Please advise message below  °

## 2023-03-27 NOTE — Telephone Encounter (Signed)
Patient verbally understood message below agrees to give [place] a call if symptoms does not improve and states that she will call GYN.

## 2023-03-27 NOTE — Telephone Encounter (Signed)
Caller Name: Adesuwa Call back phone #: [phone]  Reason for Call: pt states she has recently started using conjugated estrogens (PREMARIN) vaginal cream . She said after about 2 weeks she felt like she was getting a yeast infection but then it went away. Now she is having some discomfort/burning with urination. Could this be from the cream? Would she need to provide a urine specimen or have OV? Please call to advise.

## 2023-03-27 NOTE — Telephone Encounter (Signed)
Patient having some issues in vaginal area after starting on vaginal cream also dysuria. Does patient need to come in for treatment or is there something that  could be sent in?

## 2023-03-28 ENCOUNTER — Ambulatory Visit (INDEPENDENT_AMBULATORY_CARE_PROVIDER_SITE_OTHER): Payer: PRIVATE HEALTH INSURANCE | Admitting: Family Medicine

## 2023-03-28 ENCOUNTER — Encounter: Payer: Self-pay | Admitting: Family Medicine

## 2023-03-28 VITALS — BP 150/80 | HR 79 | Temp 98.3°F | Ht 65.0 in | Wt 159.2 lb

## 2023-03-28 DIAGNOSIS — I10 Essential (primary) hypertension: Secondary | ICD-10-CM

## 2023-03-28 DIAGNOSIS — N898 Other specified noninflammatory disorders of vagina: Secondary | ICD-10-CM

## 2023-03-28 DIAGNOSIS — N951 Menopausal and female climacteric states: Secondary | ICD-10-CM | POA: Diagnosis not present

## 2023-03-28 LAB — POCT URINALYSIS DIPSTICK
Bilirubin, UA: NEGATIVE
Glucose, UA: NEGATIVE
Ketones, UA: NEGATIVE
Leukocytes, UA: NEGATIVE
Nitrite, UA: NEGATIVE
Protein, UA: NEGATIVE
Spec Grav, UA: <=1.005 — AB (ref 1.010–1.025)
Urobilinogen, UA: 0.2 E.U./dL
pH, UA: 7 (ref 5.0–8.0)

## 2023-03-28 MED ORDER — CLONIDINE HCL 0.1 MG PO TABS: 0.1000 mg | ORAL_TABLET | Freq: Two times a day (BID) | ORAL | 1 refills | Status: DC

## 2023-03-28 MED ORDER — FLUCONAZOLE 150 MG PO TABS: 150.0000 mg | ORAL_TABLET | Freq: Once | ORAL | 0 refills | Status: AC

## 2023-03-28 NOTE — Progress Notes (Signed)
Established Patient Office Visit   Subjective:  Patient ID: Tara Avery, female    DOB: 1961-05-11  Age: 62 y.o. MRN: [id number]  Chief Complaint  Patient presents with   Rash    Vaginal irritation, milky discharge little swollen, hot flashes worse after starting on Premarin cream.     Rash   Encounter Diagnoses  Name Primary?   Vaginal irritation Yes   Hot flashes, menopausal    Essential hypertension    Developed vaginal irritation with a watery discharge after using a small amount of vaginal estrogen 3 times weekly for 5 weeks.  Discontinued it a week ago and it is improving.   Review of Systems  Constitutional: Negative.   HENT: Negative.    Eyes:  Negative for blurred vision, discharge and redness.  Respiratory: Negative.    Cardiovascular: Negative.   Gastrointestinal:  Negative for abdominal pain.  Genitourinary: Negative.  Negative for dysuria, frequency and urgency.  Musculoskeletal: Negative.  Negative for myalgias.  Skin:  Positive for rash.  Neurological:  Negative for tingling, loss of consciousness and weakness.  Endo/Heme/Allergies:  Negative for polydipsia.     Current Outpatient Medications:    atorvastatin (LIPITOR) 10 MG tablet, TAKE 1 TABLET BY MOUTH EVERY DAY, Disp: 90 tablet, Rfl: 3   calcium carbonate (TUMS - DOSED IN MG ELEMENTAL CALCIUM) 500 MG chewable tablet, Chew 1 tablet by mouth daily., Disp: , Rfl:    cetirizine (ZYRTEC) 10 MG tablet, Take 10 mg by mouth daily., Disp: , Rfl:    Cholecalciferol (VITAMIN D3) 2000 units TABS, Take 1 tablet by mouth daily., Disp: , Rfl:    cloNIDine (CATAPRES) 0.1 MG tablet, Take 1 tablet (0.1 mg total) by mouth 2 (two) times daily., Disp: 180 tablet, Rfl: 1   diphenhydrAMINE (BENADRYL) 25 MG tablet, Take 25 mg by mouth as needed., Disp: , Rfl:    fluconazole (DIFLUCAN) 150 MG tablet, Take 1 tablet (150 mg total) by mouth once for 1 dose., Disp: 1 tablet, Rfl: 0   ibuprofen (ADVIL,MOTRIN) 200 MG tablet,  Take 200 mg by mouth as needed., Disp: , Rfl:    losartan (COZAAR) 100 MG tablet, Take 1 tablet (100 mg total) by mouth daily., Disp: 90 tablet, Rfl: 1   MELATONIN ER PO, Take by mouth., Disp: , Rfl:    Multiple Vitamins-Minerals (CENTRUM WOMEN PO), Take 0.5 tablets by mouth daily., Disp: , Rfl:    Simethicone (GAS-X PO), Take 1 tablet by mouth as needed., Disp: , Rfl:    conjugated estrogens (PREMARIN) vaginal cream, Place 1 Applicatorful vaginally daily. (Patient not taking: Reported on 03/28/2023), Disp: 42.5 g, Rfl: 12   Objective:     BP (!) 150/80 (BP Location: Right Arm, Patient Position: Sitting, Cuff Size: Normal)   Pulse 79   Temp 98.3 F (36.8 C) (Temporal)   Ht 5\' 5"  (1.651 m)   Wt 159 lb 3.2 oz (72.2 kg)   SpO2 97%   BMI 26.49 kg/m    Physical Exam Constitutional:      General: She is not in acute distress.    Appearance: Normal appearance. She is not ill-appearing, toxic-appearing or diaphoretic.  HENT:     Head: Normocephalic and atraumatic.     Right Ear: External ear normal.     Left Ear: External ear normal.  Eyes:     General: No scleral icterus.       Right eye: No discharge.        Left eye: No  discharge.     Extraocular Movements: Extraocular movements intact.     Conjunctiva/sclera: Conjunctivae normal.  Pulmonary:     Effort: Pulmonary effort is normal. No respiratory distress.  Skin:    General: Skin is warm and dry.  Neurological:     Mental Status: She is alert and oriented to person, place, and time.  Psychiatric:        Mood and Affect: Mood normal.        Behavior: Behavior normal.      Results for orders placed or performed in visit on 03/28/23  POCT Urinalysis Dipstick  Result Value Ref Range   Color, UA     Clarity, UA     Glucose, UA Negative Negative   Bilirubin, UA neg    Ketones, UA neg    Spec Grav, UA <=1.005 (A) 1.010 - 1.025   Blood, UA 1+    pH, UA 7.0 5.0 - 8.0   Protein, UA Negative Negative   Urobilinogen, UA 0.2  0.2 or 1.0 E.U./dL   Nitrite, UA neg    Leukocytes, UA Negative Negative   Appearance     Odor        The 10-year ASCVD risk score (Arnett DK, et al., 2019) is: 7.3%    Assessment & Plan:   Vaginal irritation -     POCT urinalysis dipstick -     Fluconazole; Take 1 tablet (150 mg total) by mouth once for 1 dose.  Dispense: 1 tablet; Refill: 0  Hot flashes, menopausal -     cloNIDine HCl; Take 1 tablet (0.1 mg total) by mouth 2 (two) times daily.  Dispense: 180 tablet; Refill: 1  Essential hypertension -     cloNIDine HCl; Take 1 tablet (0.1 mg total) by mouth 2 (two) times daily.  Dispense: 180 tablet; Refill: 1    Return in about 8 weeks (around 05/23/2023).  Continue losartan and add clonidine 0.1 mg twice daily.  Mliss Sax, MD

## 2023-03-28 NOTE — Telephone Encounter (Signed)
Called patient to schedule an appointment for evaluation, no answer LMTCB and schedule.

## 2023-04-07 ENCOUNTER — Telehealth: Payer: Self-pay | Admitting: Family Medicine

## 2023-04-07 NOTE — Telephone Encounter (Signed)
Pts husband Darryl would like a call to discuss what is best for his wife having [place], vomiting, diarrhea.

## 2023-04-07 NOTE — Telephone Encounter (Signed)
Returned patients husband call patient have had 2 days of feeling very nauseous no vomiting and started with diarrhea today. Patient have not had much of an appetite no other symptoms. Declined OV husband just calling to see if there was a stomach bug going around. Advised husband to keep an eye on patient encourage lots of fluids small amounts watch for new symptoms if this occurs or patient becomes worse give [place] a call for an appointment.

## 2023-04-07 NOTE — Telephone Encounter (Signed)
Please call husband Darryl at [phone]

## 2023-04-20 ENCOUNTER — Other Ambulatory Visit: Payer: Self-pay | Admitting: Family Medicine

## 2023-04-20 DIAGNOSIS — E7800 Pure hypercholesterolemia, unspecified: Secondary | ICD-10-CM

## 2023-04-24 NOTE — Progress Notes (Signed)
Office Visit Note  Patient: Tara Avery             Date of Birth: 07/06/1961           MRN: [id number]             PCP: Mliss Sax, MD Referring: Mliss Sax,* Visit Date: 05/06/2023 Occupation: @GUAROCC @  Subjective:  Pain in multiple joints  History of Present Illness: Tara Avery is a 62 y.o. female with history of osteoarthritis and degenerative disc disease.  She returns today after her last visit in August 2023.  She states she continues to have pain and discomfort in her bilateral hands especially the CMC joints.  She has not been using the brace on a regular basis.  She continues to have discomfort in her knee joints.  She has not noticed any joint swelling.  She has been having lower back pain which radiates to her left lower extremity at times.  She continues to have SI joint discomfort.  She also has discomfort in her feet especially in her bunions.  She recently had a viral infection with upper respiratory symptoms and diarrhea.  The symptoms have gradually improved.  She continues to have diarrhea alternating with constipation which has been a longstanding problem.    Activities of Daily Living:  Patient reports morning stiffness for a few hours.   Patient Reports nocturnal pain.  Difficulty dressing/grooming: Reports Difficulty climbing stairs: Reports Difficulty getting out of chair: Reports Difficulty using hands for taps, buttons, cutlery, and/or writing: Reports  Review of Systems  Constitutional:  Positive for fatigue.  HENT:  Positive for mouth dryness. Negative for mouth sores.   Eyes:  Positive for dryness.  Respiratory:  Negative for shortness of breath.   Cardiovascular:  Negative for chest pain.  Gastrointestinal:  Positive for constipation and diarrhea. Negative for blood in stool.  Endocrine: Negative for increased urination.  Genitourinary:  Negative for involuntary urination.  Musculoskeletal:  Positive for joint pain,  gait problem, joint pain, myalgias, morning stiffness, muscle tenderness and myalgias. Negative for joint swelling and muscle weakness.  Skin:  Positive for sensitivity to sunlight. Negative for color change, rash and hair loss.  Allergic/Immunologic: Negative for susceptible to infections.  Neurological:  Positive for numbness and headaches. Negative for dizziness.  Hematological:  Negative for swollen glands.  Psychiatric/Behavioral:  Negative for depressed mood and sleep disturbance. The patient is nervous/anxious.     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   History of psoriasis 05/20/2019   History of sarcoidosis 05/20/2019   Allergic rhinitis 05/20/2019   Sciatica of left side 05/20/2019   Arthralgia 05/20/2019   Elevated LDL cholesterol level 01/07/2018   Healthcare maintenance 01/07/2018    Past Medical History:  Diagnosis Date   Allergy    Arthritis    Chicken pox    Depression    Emphysema of lung (HCC)    when young   Frequent headaches    GERD (gastroesophageal reflux disease)    Hyperlipidemia    Migraine    Urinary tract infection     Family History  Problem Relation Age of  Onset   Alcohol abuse Mother    Arthritis Mother    Depression Mother    Hyperlipidemia Mother    Hypertension Mother    Kidney disease Mother    Alcohol abuse Father    Early death Father    Aneurysm Father    Depression Sister    Diabetes Sister    Heart attack Sister    Hyperlipidemia Sister    Hypertension Sister    Miscarriages / [place] Sister    Stroke Sister    Healthy Daughter    Heart attack  Maternal Grandfather    Hypertension Maternal Grandfather    Hyperlipidemia Maternal Grandfather    Arthritis Sister    Past Surgical History:  Procedure Laterality Date   ABDOMINAL HYSTERECTOMY  2002   partial   BUNIONECTOMY Left 06/1996   Social History   Social History Narrative   Not on file   Immunization History  Administered Date(s) Administered   Influenza Inj Mdck Quad Pf 08/13/2022   Influenza,inj,Quad PF,6+ Mos 10/09/2021   PFIZER Comirnaty(Gray Top)Covid-19 Tri-Sucrose Vaccine 09/03/2020, 03/30/2021   PFIZER(Purple Top)SARS-COV-2 Vaccination 02/04/2020, 02/28/2020, 08/24/2021     Objective: Vital Signs: BP (!) 153/87 (BP Location: Left Arm, Patient Position: Sitting, Cuff Size: Normal)   Pulse (!) 58   Resp 17   Ht 5\' 5"  (1.651 m)   Wt 154 lb 6.4 oz (70 kg)   BMI 25.69 kg/m    Physical Exam Vitals and nursing note reviewed.  Constitutional:      Appearance: She is well-developed.  HENT:     Head: Normocephalic and atraumatic.  Eyes:     Conjunctiva/sclera: Conjunctivae normal.  Cardiovascular:     Rate and Rhythm: Normal rate and regular rhythm.     Heart sounds: Normal heart sounds.  Pulmonary:     Effort: Pulmonary effort is normal.     Breath sounds: Normal breath sounds.  Abdominal:     General: Bowel sounds are normal.     Palpations: Abdomen is soft.  Musculoskeletal:     Cervical back: Normal range of motion.  Lymphadenopathy:     Cervical: No cervical adenopathy.  Skin:    General: Skin is warm and dry.     Capillary Refill: Capillary refill takes less than 2 seconds.  Neurological:     Mental Status: She is alert and oriented to person, place, and time.  Psychiatric:        Behavior: Behavior normal.      Musculoskeletal Exam: Cervical spine, thoracic and lumbar spine were in good range of motion.  She has some discomfort range of motion of the lumbar spine.  Shoulder joints, elbow joints, wrist joints in good range of motion.  She  had bilateral CMC PIP and DIP thickening consistent with osteoarthritis.  No synovitis was noted.  Hip joints were in good range of motion.  She had tenderness on palpation over left trochanteric bursa.  Knee joints were in good range of motion without any warmth swelling or effusion.  She had left bunionectomy.  Right first toe bunion was noted.  No synovitis was noted.  CDAI Exam: CDAI Score: -- Patient Global: --; Provider Global: -- Swollen: --; Tender: -- Joint Exam 05/06/2023   No joint exam has been documented for this visit   There is currently no information documented on the homunculus. Go to the Rheumatology activity and complete the homunculus joint exam.  Investigation: No additional findings.  Imaging: No results found.  Recent Labs: Lab  Results  Component Value Date   WBC 6.6 02/12/2022   HGB 13.9 02/12/2022   PLT 256.0 02/12/2022   NA 144 02/20/2023   K 5.0 02/20/2023   CL 107 02/20/2023   CO2 25 02/20/2023   GLUCOSE 85 02/20/2023   BUN 16 02/20/2023   CREATININE 0.85 02/20/2023   BILITOT 0.4 06/03/2022   ALKPHOS 67 06/03/2022   AST 18 06/03/2022   ALT 16 06/03/2022   PROT 6.9 06/03/2022   ALBUMIN 4.2 06/03/2022   CALCIUM 9.4 02/20/2023    Speciality Comments: No specialty comments available.  Procedures:  No procedures performed Allergies: Prednisone, Codeine, and Latex   Assessment / Plan:     Visit Diagnoses: Primary osteoarthritis of both hands -she continues to have pain and discomfort in her bilateral hands.  Bilateral PIP DIP and CMC thickening was noted.  Patient has a left CMC brace which helps but she forgets to wear it.  She is requesting a brace for her right CMC joint.  I will give her a prescription.  Clinical and radiographic findings are consistent with osteoarthritis.  A handout on hand exercises was given and joint protection was discussed.  Chondromalacia of both patellae-she continues to have pain and discomfort in her bilateral  knee joints.  I will refer her to physical therapy.  A handout on knee exercises was also given.  Trochanteric bursitis, left hip-she has been having pain and discomfort over the left trochanteric bursa.  She has discomfort when she sleeps on her left side.  A handout on IT band stretches was given and she was referred to physical therapy.  Primary osteoarthritis of both feet-she has chronic discomfort in her feet.  She had left bunionectomy.  She states she was walking on some concrete floor for couple of hours which causes discomfort in her right first toe.  No swelling was noted.  Proper fitting shoes were advised.  Chronic SI joint pain -she is going to comfort in her SI joints.  X-rays were unremarkable except for some degenerative changes.  DDD (degenerative disc disease), lumbar -she continues to have lower back pain and describes left-sided radiculopathy.  I believe the left-sided radiculopathy which she is describing is coming from left trochanteric bursitis.  X-rays showed mild dextroscoliosis and significant disc disease.  I will refer her to physical therapy.  Temporomandibular joint dysfunction-doing better.  Postural kyphosis of thoracic region  Osteopenia of multiple sites - DEXA 11/12/2021: The BMD measured at Femur Neck Left is 0.801 g/cm2 with a T-score of-1.7.  Use of calcium rich diet, vitamin D and resistive exercises were discussed.  History of sarcoidosis - She had recently had x-ray of her rib cage which was unremarkable.  She denies any shortness of breath.  No inflammatory arthritis or rash was noted.  Primary hypertension-she is on Cozaar.  Her blood pressure was elevated at 168/93.  She was advised to monitor blood pressure closely and follow-up with the PCP.  Psoriasis-no active lesions were noted.  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  Elevated LDL cholesterol level  Thoracic outlet syndrome  Orders: Orders Placed This Encounter  Procedures    Ambulatory referral to Physical Therapy   No orders of the defined types were placed in this encounter.    Follow-Up Instructions: Return in about 6 months (around 11/05/2023) for Osteoarthritis.   Pollyann Savoy, MD  Note - This record has been created using Animal nutritionist.  Chart creation errors have been sought, but may not always  have been located. Such creation errors do not reflect on  the standard of medical care.

## 2023-05-06 ENCOUNTER — Encounter: Payer: Self-pay | Admitting: Rheumatology

## 2023-05-06 ENCOUNTER — Ambulatory Visit: Payer: PRIVATE HEALTH INSURANCE | Attending: Rheumatology | Admitting: Rheumatology

## 2023-05-06 ENCOUNTER — Telehealth: Payer: Self-pay | Admitting: Rheumatology

## 2023-05-06 VITALS — BP 153/87 | HR 58 | Resp 17 | Ht 65.0 in | Wt 154.4 lb

## 2023-05-06 DIAGNOSIS — E7800 Pure hypercholesterolemia, unspecified: Secondary | ICD-10-CM

## 2023-05-06 DIAGNOSIS — M7062 Trochanteric bursitis, left hip: Secondary | ICD-10-CM | POA: Diagnosis not present

## 2023-05-06 DIAGNOSIS — I10 Essential (primary) hypertension: Secondary | ICD-10-CM

## 2023-05-06 DIAGNOSIS — M26609 Unspecified temporomandibular joint disorder, unspecified side: Secondary | ICD-10-CM

## 2023-05-06 DIAGNOSIS — G540 Brachial plexus disorders: Secondary | ICD-10-CM

## 2023-05-06 DIAGNOSIS — M19041 Primary osteoarthritis, right hand: Secondary | ICD-10-CM

## 2023-05-06 DIAGNOSIS — L920 Granuloma annulare: Secondary | ICD-10-CM

## 2023-05-06 DIAGNOSIS — M2242 Chondromalacia patellae, left knee: Secondary | ICD-10-CM

## 2023-05-06 DIAGNOSIS — M2241 Chondromalacia patellae, right knee: Secondary | ICD-10-CM

## 2023-05-06 DIAGNOSIS — M533 Sacrococcygeal disorders, not elsewhere classified: Secondary | ICD-10-CM

## 2023-05-06 DIAGNOSIS — L409 Psoriasis, unspecified: Secondary | ICD-10-CM

## 2023-05-06 DIAGNOSIS — Z862 Personal history of diseases of the blood and blood-forming organs and certain disorders involving the immune mechanism: Secondary | ICD-10-CM

## 2023-05-06 DIAGNOSIS — M8589 Other specified disorders of bone density and structure, multiple sites: Secondary | ICD-10-CM

## 2023-05-06 DIAGNOSIS — M5136 Other intervertebral disc degeneration, lumbar region: Secondary | ICD-10-CM

## 2023-05-06 DIAGNOSIS — M4004 Postural kyphosis, thoracic region: Secondary | ICD-10-CM

## 2023-05-06 DIAGNOSIS — M19071 Primary osteoarthritis, right ankle and foot: Secondary | ICD-10-CM | POA: Diagnosis not present

## 2023-05-06 DIAGNOSIS — M19072 Primary osteoarthritis, left ankle and foot: Secondary | ICD-10-CM

## 2023-05-06 DIAGNOSIS — G8929 Other chronic pain: Secondary | ICD-10-CM

## 2023-05-06 DIAGNOSIS — M19042 Primary osteoarthritis, left hand: Secondary | ICD-10-CM

## 2023-05-06 NOTE — Patient Instructions (Addendum)
Exercises for Chronic Knee Pain Chronic knee pain is pain that lasts longer than 3 months. For most people with chronic knee pain, exercise and weight loss is an important part of treatment. Your health care provider may want you to focus on: Making the muscles that support your knee stronger. This can take pressure off your knee and reduce pain. Preventing knee stiffness. How far you can move your knee, keeping it there or making it farther. Losing weight (if this applies) to take pressure off your knee, lower your risk for injury, and make it easier for you to exercise. Your provider will help you make an exercise program that fits your needs and physical abilities. Below are simple, low-impact exercises you can do at home. Ask your provider or physical therapist how often you should do your exercise program and how many times to repeat each exercise. General safety tips  Get your provider's approval before doing any exercises. Start slowly and stop any time you feel pain. Do not exercise if your knee pain is flaring up. Warm up first. Stretching a cold muscle can cause an injury. Do 5-10 minutes of easy movement or light stretching before beginning your exercises. Do 5-10 minutes of low-impact activity (like walking or cycling) before starting strengthening exercises. Contact your provider any time you have pain during or after exercising. Exercise can cause discomfort but should not be painful. It is normal to be a little stiff or sore after exercising. Stretching and range-of-motion exercises Front thigh stretch  Stand up straight and support your body by holding on to a chair or resting one hand on a wall. With your legs straight and close together, bend one knee to lift your heel up toward your butt. Using one hand for support, grab your ankle with your free hand. Pull your foot up closer toward your butt to feel the stretch in front of your thigh. Hold the stretch for 30  seconds. Repeat __________ times. Complete this exercise __________ times a day. Back thigh stretch  Sit on the floor with your back straight and your legs out straight in front of you. Place the palms of your hands on the floor and slide them toward your feet as you bend at the hip. Try to touch your nose to your knees and feel the stretch in the back of your thighs. Hold for 30 seconds. Repeat __________ times. Complete this exercise __________ times a day. Calf stretch  Stand facing a wall. Place the palms of your hands flat against the wall, arms extended, and lean slightly against the wall. Get into a lunge position with one leg bent at the knee and the other leg stretched out straight behind you. Keep both feet facing the wall and increase the bend in your knee while keeping the heel of the other leg flat on the ground. You should feel the stretch in your calf. Hold for 30 seconds. Repeat __________ times. Complete this exercise __________ times a day. Strengthening exercises Straight leg lift  Lie on your back with one knee bent and the other leg out straight. Slowly lift the straight leg without bending the knee. Lift until your foot is about 12 inches (30 cm) off the floor. Hold for 3-5 seconds and slowly lower your leg. Repeat __________ times. Complete this exercise __________ times a day. Single leg dip  Stand between two chairs and put both hands on the backs of the chairs for support. Extend one leg out straight with your body   weight resting on the heel of the standing leg. Slowly bend your standing knee to dip your body to the level that is comfortable for you. Hold for 3-5 seconds. Repeat __________ times. Complete this exercise __________ times a day. Hamstring curls  Stand straight, knees close together, facing the back of a chair. Hold on to the back of a chair with both hands. Keep one leg straight. Bend the other knee while bringing the heel up toward the butt  until the knee is bent at a 90-degree angle (right angle). Hold for 3-5 seconds. Repeat __________ times. Complete this exercise __________ times a day. Wall squat  Stand straight with your back, hips, and head against a wall. Step forward one foot at a time with your back still against the wall. Your feet should be 2 feet (61 cm) from the wall at shoulder width. Keeping your back, hips, and head against the wall, slide down the wall to as close to a sitting position as you can get. Hold for 5-10 seconds, then slowly slide back up. Repeat __________ times. Complete this exercise __________ times a day. Step-ups  Stand in front of a sturdy platform or stool that is about 6 inches (15 cm) high. Slowly step up with your left / right foot, keeping your knee in line with your hip and foot. Do not let your knee bend so far that you cannot see your toes. Hold on to a chair for balance, but do not use it for support. Slowly unlock your knee and lower yourself to the starting position. Repeat __________ times. Complete this exercise __________ times a day. Contact a health care provider if: Your exercises cause pain. Your pain is worse after you exercise. Your pain prevents you from doing your exercises. This information is not intended to replace advice given to you by your health care provider. Make sure you discuss any questions you have with your health care provider. Document Revised: 11/26/2022 Document Reviewed: 11/26/2022 [employer] Patient Education  2024 [employer] Hand Exercises Hand exercises can be helpful for almost anyone. They can strengthen your hands and improve flexibility and movement. The exercises can also increase blood flow to the hands. These results can make your work and daily tasks easier for you. Hand exercises can be especially helpful for people who have joint pain from arthritis or nerve damage from using their hands over and over. These exercises can also help  people who injure a hand. Exercises Most of these hand exercises are gentle stretching and motion exercises. It is usually safe to do them often throughout the day. Warming up your hands before exercise may help reduce stiffness. You can do this with gentle massage or by placing your hands in warm water for 10-15 minutes. It is normal to feel some stretching, pulling, tightness, or mild discomfort when you begin new exercises. In time, this will improve. Remember to always be careful and stop right away if you feel sudden, very bad pain or your pain gets worse. You want to get better and be safe. Ask your health care provider which exercises are safe for you. Do exercises exactly as told by your provider and adjust them as told. Do not begin these exercises until told by your provider. Knuckle bend or "claw" fist  Stand or sit with your arm, hand, and all five fingers pointed straight up. Make sure to keep your wrist straight. Gently bend your fingers down toward your palm until the tips of your fingers  are touching your palm. Keep your big knuckle straight and only bend the small knuckles in your fingers. Hold this position for 10 seconds. Straighten your fingers back to your starting position. Repeat this exercise 5-10 times with each hand. Full finger fist  Stand or sit with your arm, hand, and all five fingers pointed straight up. Make sure to keep your wrist straight. Gently bend your fingers into your palm until the tips of your fingers are touching the middle of your palm. Hold this position for 10 seconds. Extend your fingers back to your starting position, stretching every joint fully. Repeat this exercise 5-10 times with each hand. Straight fist  Stand or sit with your arm, hand, and all five fingers pointed straight up. Make sure to keep your wrist straight. Gently bend your fingers at the big knuckle, where your fingers meet your hand, and at the middle knuckle. Keep the knuckle at  the tips of your fingers straight and try to touch the bottom of your palm. Hold this position for 10 seconds. Extend your fingers back to your starting position, stretching every joint fully. Repeat this exercise 5-10 times with each hand. Tabletop  Stand or sit with your arm, hand, and all five fingers pointed straight up. Make sure to keep your wrist straight. Gently bend your fingers at the big knuckle, where your fingers meet your hand, as far down as you can. Keep the small knuckles in your fingers straight. Think of forming a tabletop with your fingers. Hold this position for 10 seconds. Extend your fingers back to your starting position, stretching every joint fully. Repeat this exercise 5-10 times with each hand. Finger spread  Place your hand flat on a table with your palm facing down. Make sure your wrist stays straight. Spread your fingers and thumb apart from each other as far as you can until you feel a gentle stretch. Hold this position for 10 seconds. Bring your fingers and thumb tight together again. Hold this position for 10 seconds. Repeat this exercise 5-10 times with each hand. Making circles  Stand or sit with your arm, hand, and all five fingers pointed straight up. Make sure to keep your wrist straight. Make a circle by touching the tip of your thumb to the tip of your index finger. Hold for 10 seconds. Then open your hand wide. Repeat this motion with your thumb and each of your fingers. Repeat this exercise 5-10 times with each hand. Thumb motion  Sit with your forearm resting on a table and your wrist straight. Your thumb should be facing up toward the ceiling. Keep your fingers relaxed as you move your thumb. Lift your thumb up as high as you can toward the ceiling. Hold for 10 seconds. Bend your thumb across your palm as far as you can, reaching the tip of your thumb for the small finger (pinkie) side of your palm. Hold for 10 seconds. Repeat this exercise  5-10 times with each hand. Grip strengthening  Hold a stress ball or other soft ball in the middle of your hand. Slowly increase the pressure, squeezing the ball as much as you can without causing pain. Think of bringing the tips of your fingers into the middle of your palm. All of your finger joints should bend when doing this exercise. Hold your squeeze for 10 seconds, then relax. Repeat this exercise 5-10 times with each hand. Contact a health care provider if: Your hand pain or discomfort gets much worse when you  do an exercise. Your hand pain or discomfort does not improve within 2 hours after you exercise. If you have either of these problems, stop doing these exercises right away. Do not do them again unless your provider says that you can. Get help right away if: You develop sudden, severe hand pain or swelling. If this happens, stop doing these exercises right away. Do not do them again unless your provider says that you can. This information is not intended to replace advice given to you by your health care provider. Make sure you discuss any questions you have with your health care provider. Document Revised: 11/26/2022 Document Reviewed: 11/26/2022 [employer] Patient Education  2024 [employer]  Low Back Sprain or Strain Rehab Ask your health care provider which exercises are safe for you. Do exercises exactly as told by your health care provider and adjust them as directed. It is normal to feel mild stretching, pulling, tightness, or discomfort as you do these exercises. Stop right away if you feel sudden pain or your pain gets worse. Do not begin these exercises until told by your health care provider. Stretching and range-of-motion exercises These exercises warm up your muscles and joints and improve the movement and flexibility of your back. These exercises also help to relieve pain, numbness, and tingling. Lumbar rotation  Lie on your back on a firm bed or the floor with your  knees bent. Straighten your arms out to your sides so each arm forms a 90-degree angle (right angle) with a side of your body. Slowly move (rotate) both of your knees to one side of your body until you feel a stretch in your lower back (lumbar). Try not to let your shoulders lift off the floor. Hold this position for __________ seconds. Tense your abdominal muscles and slowly move your knees back to the starting position. Repeat this exercise on the other side of your body. Repeat __________ times. Complete this exercise __________ times a day. Single knee to chest  Lie on your back on a firm bed or the floor with both legs straight. Bend one of your knees. Use your hands to move your knee up toward your chest until you feel a gentle stretch in your lower back and buttock. Hold your leg in this position by holding on to the front of your knee. Keep your other leg as straight as possible. Hold this position for __________ seconds. Slowly return to the starting position. Repeat with your other leg. Repeat __________ times. Complete this exercise __________ times a day. Prone extension on elbows  Lie on your abdomen on a firm bed or the floor (prone position). Prop yourself up on your elbows. Use your arms to help lift your chest up until you feel a gentle stretch in your abdomen and your lower back. This will place some of your body weight on your elbows. If this is uncomfortable, try stacking pillows under your chest. Your hips should stay down, against the surface that you are lying on. Keep your hip and back muscles relaxed. Hold this position for __________ seconds. Slowly relax your upper body and return to the starting position. Repeat __________ times. Complete this exercise __________ times a day. Strengthening exercises These exercises build strength and endurance in your back. Endurance is the ability to use your muscles for a long time, even after they get tired. Pelvic  tilt This exercise strengthens the muscles that lie deep in the abdomen. Lie on your back on a firm bed or  the floor with your legs extended. Bend your knees so they are pointing toward the ceiling and your feet are flat on the floor. Tighten your lower abdominal muscles to press your lower back against the floor. This motion will tilt your pelvis so your tailbone points up toward the ceiling instead of pointing to your feet or the floor. To help with this exercise, you may place a small towel under your lower back and try to push your back into the towel. Hold this position for __________ seconds. Let your muscles relax completely before you repeat this exercise. Repeat __________ times. Complete this exercise __________ times a day. Alternating arm and leg raises  Get on your hands and knees on a firm surface. If you are on a hard floor, you may want to use padding, such as an exercise mat, to cushion your knees. Line up your arms and legs. Your hands should be directly below your shoulders, and your knees should be directly below your hips. Lift your left leg behind you. At the same time, raise your right arm and straighten it in front of you. Do not lift your leg higher than your hip. Do not lift your arm higher than your shoulder. Keep your abdominal and back muscles tight. Keep your hips facing the ground. Do not arch your back. Keep your balance carefully, and do not hold your breath. Hold this position for __________ seconds. Slowly return to the starting position. Repeat with your right leg and your left arm. Repeat __________ times. Complete this exercise __________ times a day. Abdominal set with straight leg raise  Lie on your back on a firm bed or the floor. Bend one of your knees and keep your other leg straight. Tense your abdominal muscles and lift your straight leg up, 4-6 inches (10-15 cm) off the ground. Keep your abdominal muscles tight and hold this position for  __________ seconds. Do not hold your breath. Do not arch your back. Keep it flat against the ground. Keep your abdominal muscles tense as you slowly lower your leg back to the starting position. Repeat with your other leg. Repeat __________ times. Complete this exercise __________ times a day. Single leg lower with bent knees Lie on your back on a firm bed or the floor. Tense your abdominal muscles and lift your feet off the floor, one foot at a time, so your knees and hips are bent in 90-degree angles (right angles). Your knees should be over your hips and your lower legs should be parallel to the floor. Keeping your abdominal muscles tense and your knee bent, slowly lower one of your legs so your toe touches the ground. Lift your leg back up to return to the starting position. Do not hold your breath. Do not let your back arch. Keep your back flat against the ground. Repeat with your other leg. Repeat __________ times. Complete this exercise __________ times a day. Posture and body mechanics Good posture and healthy body mechanics can help to relieve stress in your body's tissues and joints. Body mechanics refers to the movements and positions of your body while you do your daily activities. Posture is part of body mechanics. Good posture means: Your spine is in its natural S-curve position (neutral). Your shoulders are pulled back slightly. Your head is not tipped forward (neutral). Follow these guidelines to improve your posture and body mechanics in your everyday activities. Standing  When standing, keep your spine neutral and your feet about hip-width apart. Keep  a slight bend in your knees. Your ears, shoulders, and hips should line up. When you do a task in which you stand in one place for a long time, place one foot up on a stable object that is 2-4 inches (5-10 cm) high, such as a footstool. This helps keep your spine neutral. Sitting  When sitting, keep your spine neutral and  keep your feet flat on the floor. Use a footrest, if necessary, and keep your thighs parallel to the floor. Avoid rounding your shoulders, and avoid tilting your head forward. When working at a desk or a computer, keep your desk at a height where your hands are slightly lower than your elbows. Slide your chair under your desk so you are close enough to maintain good posture. When working at a computer, place your monitor at a height where you are looking straight ahead and you do not have to tilt your head forward or downward to look at the screen. Resting When lying down and resting, avoid positions that are most painful for you. If you have pain with activities such as sitting, bending, stooping, or squatting, lie in a position in which your body does not bend very much. For example, avoid curling up on your side with your arms and knees near your chest (fetal position). If you have pain with activities such as standing for a long time or reaching with your arms, lie with your spine in a neutral position and bend your knees slightly. Try the following positions: Lying on your side with a pillow between your knees. Lying on your back with a pillow under your knees. Lifting  When lifting objects, keep your feet at least shoulder-width apart and tighten your abdominal muscles. Bend your knees and hips and keep your spine neutral. It is important to lift using the strength of your legs, not your back. Do not lock your knees straight out. Always ask for help to lift heavy or awkward objects. This information is not intended to replace advice given to you by your health care provider. Make sure you discuss any questions you have with your health care provider. Document Revised: 01/29/2021 Document Reviewed: 01/29/2021 Elsevier Patient Education  2024 [employer]  Iliotibial Band Syndrome Rehab Ask your health care provider which exercises are safe for you. Do exercises exactly as told by your  health care provider and adjust them as directed. It is normal to feel mild stretching, pulling, tightness, or discomfort as you do these exercises. Stop right away if you feel sudden pain or your pain gets significantly worse. Do not begin these exercises until told by your health care provider. Stretching and range-of-motion exercises These exercises warm up your muscles and joints and improve the movement and flexibility of your hip and pelvis. Quadriceps stretch, prone  Lie on your abdomen (prone position) on a firm surface, such as a bed or padded floor. Bend your left / right knee and reach back to hold your ankle or pant leg. If you cannot reach your ankle or pant leg, loop a belt around your foot and grab the belt instead. Gently pull your heel toward your buttocks. Your knee should not slide out to the side. You should feel a stretch in the front of your thigh and knee (quadriceps). Hold this position for __________ seconds. Repeat __________ times. Complete this exercise __________ times a day. Iliotibial band stretch An iliotibial band is a strong band of muscle tissue that runs from the outer side of  your hip to the outer side of your thigh and knee. Lie on your side with your left / right leg in the top position. Bend both of your knees and grab your left / right ankle. Stretch out your bottom arm to help you balance. Slowly bring your top knee back so your thigh goes behind your trunk. Slowly lower your top leg toward the floor until you feel a gentle stretch on the outside of your left / right hip and thigh. If you do not feel a stretch and your knee will not fall farther, place the heel of your other foot on top of your knee and pull your knee down toward the floor with your foot. Hold this position for __________ seconds. Repeat __________ times. Complete this exercise __________ times a day. Strengthening exercises These exercises build strength and endurance in your hip and  pelvis. Endurance is the ability to use your muscles for a long time, even after they get tired. Straight leg raises, side-lying This exercise strengthens the muscles that rotate the leg at the hip and move it away from your body (hip abductors). Lie on your side with your left / right leg in the top position. Lie so your head, shoulder, hip, and knee line up. You may bend your bottom knee to help you balance. Roll your hips slightly forward so your hips are stacked directly over each other and your left / right knee is facing forward. Tense the muscles in your outer thigh and lift your top leg 4-6 inches (10-15 cm). Hold this position for __________ seconds. Slowly lower your leg to return to the starting position. Let your muscles relax completely before doing another repetition. Repeat __________ times. Complete this exercise __________ times a day. Leg raises, prone This exercise strengthens the muscles that move the hips backward (hip extensors). Lie on your abdomen (prone position) on your bed or a firm surface. You can put a pillow under your hips if that is more comfortable for your lower back. Bend your left / right knee so your foot is straight up in the air. Squeeze your buttocks muscles and lift your left / right thigh off the bed. Do not let your back arch. Tense your thigh muscle as hard as you can without increasing any knee pain. Hold this position for __________ seconds. Slowly lower your leg to return to the starting position and allow it to relax completely. Repeat __________ times. Complete this exercise __________ times a day. Hip hike Stand sideways on a bottom step. Stand on your left / right leg with your other foot unsupported next to the step. You can hold on to a railing or wall for balance if needed. Keep your knees straight and your torso square. Then lift your left / right hip up toward the ceiling. Slowly let your left / right hip lower toward the floor, past the  starting position. Your foot should get closer to the floor. Do not lean or bend your knees. Repeat __________ times. Complete this exercise __________ times a day. This information is not intended to replace advice given to you by your health care provider. Make sure you discuss any questions you have with your health care provider. Document Revised: 01/19/2020 Document Reviewed: 01/19/2020 Elsevier Patient Education  2024 [employer].

## 2023-05-06 NOTE — Telephone Encounter (Signed)
Advised patient she was referred to [facility] at [facility] and the phone number is [phone]. Patient verbalized understanding.

## 2023-05-06 NOTE — Telephone Encounter (Signed)
Patient states Dr. Corliss Skains was referring her to PT and requested a return call with the name and number of the facility.

## 2023-05-21 ENCOUNTER — Telehealth: Payer: Self-pay | Admitting: Family Medicine

## 2023-05-21 NOTE — Telephone Encounter (Signed)
Pt has just tested positive for covid yesterday 05/20/23. I offered a mychart video visit appointment, she declined. She is wanting a call at [phone], she would like to know what to do. Her husband,Darrell Sirianni was seen on 05/19/23 for congestion. He has now tested positive for covid.

## 2023-05-21 NOTE — Telephone Encounter (Signed)
Spoke with patient who states that she was diagnosed with covid not sure what she needs to do.Cough, low temp body aches no mucus. Patient decline OV, advised patient to continue with OTC cough meds stay hyrdrated with plenty of water and rest. Patient agrees to advise and will call back if symptoms does not improve or she becomes worse.

## 2023-05-23 ENCOUNTER — Encounter: Payer: Self-pay | Admitting: Family Medicine

## 2023-05-23 NOTE — Telephone Encounter (Signed)
/  Patient has an appointment scheduled for  at [facility] by Dr. Doreene Burke

## 2023-05-26 ENCOUNTER — Encounter: Payer: Self-pay | Admitting: Family Medicine

## 2023-05-26 ENCOUNTER — Ambulatory Visit (INDEPENDENT_AMBULATORY_CARE_PROVIDER_SITE_OTHER): Payer: PRIVATE HEALTH INSURANCE | Admitting: Family Medicine

## 2023-05-26 VITALS — BP 126/82 | HR 77 | Temp 96.3°F | Ht 65.0 in | Wt 153.4 lb

## 2023-05-26 DIAGNOSIS — H6993 Unspecified Eustachian tube disorder, bilateral: Secondary | ICD-10-CM | POA: Diagnosis not present

## 2023-05-26 DIAGNOSIS — U071 COVID-19: Secondary | ICD-10-CM | POA: Insufficient documentation

## 2023-05-26 NOTE — Progress Notes (Signed)
Established Patient Office Visit   Subjective:  Patient ID: Tara Avery, female    DOB: [birth year]  Age: 62 y.o. MRN: [id number]  Chief Complaint  Patient presents with   Covid Positive    Follow up form positive home test. Pt states symptoms started on June 23 and tested positive June 25. Dry cough, bilateral ear pain, body aches. Pt states there is improvement but still not 100%.     HPI Encounter Diagnoses  Name Primary?   ETD (Eustachian tube dysfunction), bilateral Yes   COVID    5 to 6-day history of URI signs and symptoms with nasal congestion postnasal drip, ear congestion and cough.  Smell has been disrupted.  Tested positive for COVID several days ago.  Slowly improving but nasal and ear congestion persist.  Smell has not returned.  Flonase led to epistaxis and had to discontinue.  Cannot tolerate prednisone secondary to palpitations.   Review of Systems  Constitutional: Negative.   HENT:  Positive for congestion and hearing loss. Negative for ear discharge and ear pain.   Eyes:  Negative for blurred vision, discharge and redness.  Respiratory: Negative.  Negative for cough, sputum production and wheezing.   Cardiovascular: Negative.   Gastrointestinal:  Negative for abdominal pain.  Genitourinary: Negative.   Musculoskeletal: Negative.  Negative for myalgias.  Skin:  Negative for rash.  Neurological:  Negative for tingling, loss of consciousness and weakness.  Endo/Heme/Allergies:  Negative for polydipsia.     Current Outpatient Medications:    atorvastatin (LIPITOR) 10 MG tablet, TAKE 1 TABLET BY MOUTH EVERY DAY, Disp: 90 tablet, Rfl: 3   calcium carbonate (TUMS - DOSED IN MG ELEMENTAL CALCIUM) 500 MG chewable tablet, Chew 1 tablet by mouth daily., Disp: , Rfl:    cetirizine (ZYRTEC) 10 MG tablet, Take 10 mg by mouth daily., Disp: , Rfl:    Cholecalciferol (VITAMIN D3) 2000 units TABS, Take 1 tablet by mouth daily., Disp: , Rfl:    diphenhydrAMINE (BENADRYL)  25 MG tablet, Take 25 mg by mouth as needed., Disp: , Rfl:    ibuprofen (ADVIL,MOTRIN) 200 MG tablet, Take 200 mg by mouth as needed., Disp: , Rfl:    losartan (COZAAR) 100 MG tablet, Take 1 tablet (100 mg total) by mouth daily., Disp: 90 tablet, Rfl: 1   MELATONIN ER PO, Take by mouth., Disp: , Rfl:    Multiple Vitamins-Minerals (CENTRUM WOMEN PO), Take 0.5 tablets by mouth daily., Disp: , Rfl:    Simethicone (GAS-X PO), Take 1 tablet by mouth as needed., Disp: , Rfl:    cloNIDine (CATAPRES) 0.1 MG tablet, Take 1 tablet (0.1 mg total) by mouth 2 (two) times daily., Disp: 180 tablet, Rfl: 1   conjugated estrogens (PREMARIN) vaginal cream, Place 1 Applicatorful vaginally daily. (Patient not taking: Reported on 03/28/2023), Disp: 42.5 g, Rfl: 12   Objective:     BP 126/82   Pulse 77   Temp (!) 96.3 F (35.7 C)   Ht 5\' 5"  (1.651 m)   Wt 153 lb 6.4 oz (69.6 kg)   SpO2 99%   BMI 25.53 kg/m    Physical Exam Constitutional:      General: She is not in acute distress.    Appearance: Normal appearance. She is not ill-appearing, toxic-appearing or diaphoretic.  HENT:     Head: Normocephalic and atraumatic.     Right Ear: External ear normal. No middle ear effusion. Tympanic membrane is retracted. Tympanic membrane is not injected or erythematous.  Left Ear: External ear normal.  No middle ear effusion. Tympanic membrane is retracted. Tympanic membrane is not injected or erythematous.     Mouth/Throat:     Mouth: Mucous membranes are moist.     Pharynx: Oropharynx is clear. No oropharyngeal exudate or posterior oropharyngeal erythema.  Eyes:     General: No scleral icterus.       Right eye: No discharge.        Left eye: No discharge.     Extraocular Movements: Extraocular movements intact.     Conjunctiva/sclera: Conjunctivae normal.     Pupils: Pupils are equal, round, and reactive to light.  Cardiovascular:     Rate and Rhythm: Normal rate and regular rhythm.  Pulmonary:      Effort: Pulmonary effort is normal. No respiratory distress.     Breath sounds: Normal breath sounds. No wheezing, rhonchi or rales.  Abdominal:     General: Bowel sounds are normal.     Tenderness: There is no abdominal tenderness. There is no guarding.  Musculoskeletal:     Cervical back: No rigidity or tenderness.  Lymphadenopathy:     Cervical: No cervical adenopathy.  Skin:    General: Skin is warm and dry.  Neurological:     Mental Status: She is alert and oriented to person, place, and time.  Psychiatric:        Mood and Affect: Mood normal.        Behavior: Behavior normal.      No results found for any visits on 05/26/23.    The 10-year ASCVD risk score (Arnett DK, et al., 2019) is: 5.2%    Assessment & Plan:   ETD (Eustachian tube dysfunction), bilateral  COVID    Return Continue nasal saline.  May try Sudafed as tolerated.  Restart Flonase as tolerated..  Information given on ETD.  Advised ETD exercises.  Follow-up as needed.    Mliss Sax, MD

## 2023-07-14 ENCOUNTER — Ambulatory Visit: Payer: PRIVATE HEALTH INSURANCE | Admitting: Rheumatology

## 2023-07-18 ENCOUNTER — Other Ambulatory Visit: Payer: Self-pay | Admitting: Family Medicine

## 2023-07-18 DIAGNOSIS — I10 Essential (primary) hypertension: Secondary | ICD-10-CM

## 2023-07-31 ENCOUNTER — Encounter: Payer: Self-pay | Admitting: Rheumatology

## 2023-07-31 ENCOUNTER — Encounter: Payer: Self-pay | Admitting: Family Medicine

## 2023-07-31 DIAGNOSIS — M19071 Primary osteoarthritis, right ankle and foot: Secondary | ICD-10-CM

## 2023-08-04 NOTE — Telephone Encounter (Signed)
Ok to place referral to [facility]

## 2023-08-14 ENCOUNTER — Encounter: Payer: Self-pay | Admitting: Family Medicine

## 2023-08-19 ENCOUNTER — Encounter: Payer: Self-pay | Admitting: Family Medicine

## 2023-08-19 ENCOUNTER — Ambulatory Visit: Payer: PRIVATE HEALTH INSURANCE | Admitting: Family Medicine

## 2023-08-19 VITALS — BP 128/84 | HR 75 | Temp 98.1°F | Ht 65.0 in | Wt 154.4 lb

## 2023-08-19 DIAGNOSIS — I10 Essential (primary) hypertension: Secondary | ICD-10-CM

## 2023-08-19 DIAGNOSIS — R1310 Dysphagia, unspecified: Secondary | ICD-10-CM | POA: Diagnosis not present

## 2023-08-19 DIAGNOSIS — K580 Irritable bowel syndrome with diarrhea: Secondary | ICD-10-CM | POA: Diagnosis not present

## 2023-08-19 DIAGNOSIS — K219 Gastro-esophageal reflux disease without esophagitis: Secondary | ICD-10-CM

## 2023-08-19 NOTE — Progress Notes (Signed)
Established Patient Office Visit   Subjective:  Patient ID: Tara Avery, female    DOB: Sep 07, 1961  Age: 62 y.o. MRN: [id number]  Chief Complaint  Patient presents with   Gastroesophageal Reflux    Pt is requesting a referral to GI.     Gastroesophageal Reflux She complains of abdominal pain and heartburn. Pertinent negatives include no melena.   Encounter Diagnoses  Name Primary?   Gastroesophageal reflux disease, unspecified whether esophagitis present Yes   Dysphagia, unspecified type    Irritable bowel syndrome with diarrhea    Essential hypertension    Reports worsening reflux symptoms over the last several months and particularly in the last 2 months.  She reports dysphagia with solid foods particularly with chicken and rice.  She also reports abdominal bloating.  She has bouts of diarrhea with loose to watery stool every 2 to 3 weeks.  These bouts typically last a day or 2 and may be associated with severe cramping.  There is no blood or dark tarry stool.  Her weight loss has been intentional.  She has a longstanding history of "bowel problems".  Patient is lactose intolerant and has been consuming yogurt periodically to hopefully support her intestinal flora.  She does not correlate her symptoms with yogurt ingestion.  She has been taking 2 Tums up to 3 times daily.  She is experiencing more relief with Gaviscon.  She has been using Gas-X.  She never started the carvedilol.  Blood pressure has normalized without it.   Review of Systems  Constitutional: Negative.   HENT: Negative.    Eyes:  Negative for blurred vision, discharge and redness.  Respiratory: Negative.    Cardiovascular: Negative.   Gastrointestinal:  Positive for abdominal pain, diarrhea and heartburn. Negative for blood in stool, constipation and melena.  Genitourinary: Negative.   Musculoskeletal: Negative.  Negative for myalgias.  Skin:  Negative for rash.  Neurological:  Negative for tingling, loss of  consciousness and weakness.  Endo/Heme/Allergies:  Negative for polydipsia.     Current Outpatient Medications:    atorvastatin (LIPITOR) 10 MG tablet, TAKE 1 TABLET BY MOUTH EVERY DAY, Disp: 90 tablet, Rfl: 3   calcium carbonate (TUMS - DOSED IN MG ELEMENTAL CALCIUM) 500 MG chewable tablet, Chew 1 tablet by mouth daily., Disp: , Rfl:    cetirizine (ZYRTEC) 10 MG tablet, Take 10 mg by mouth daily., Disp: , Rfl:    Cholecalciferol (VITAMIN D3) 2000 units TABS, Take 1 tablet by mouth daily., Disp: , Rfl:    diphenhydrAMINE (BENADRYL) 25 MG tablet, Take 25 mg by mouth as needed., Disp: , Rfl:    ibuprofen (ADVIL,MOTRIN) 200 MG tablet, Take 200 mg by mouth as needed., Disp: , Rfl:    losartan (COZAAR) 100 MG tablet, TAKE 1 TABLET BY MOUTH EVERY DAY, Disp: 90 tablet, Rfl: 3   MELATONIN ER PO, Take by mouth., Disp: , Rfl:    Multiple Vitamins-Minerals (CENTRUM WOMEN PO), Take 0.5 tablets by mouth daily., Disp: , Rfl:    Simethicone (GAS-X PO), Take 1 tablet by mouth as needed., Disp: , Rfl:    Objective:     BP 128/84   Pulse 75   Temp 98.1 F (36.7 C)   Ht 5\' 5"  (1.651 m)   Wt 154 lb 6.4 oz (70 kg)   SpO2 98%   BMI 25.69 kg/m  BP Readings from Last 3 Encounters:  08/19/23 128/84  05/26/23 126/82  05/06/23 (!) 153/87   Wt Readings from Last  3 Encounters:  08/19/23 154 lb 6.4 oz (70 kg)  05/26/23 153 lb 6.4 oz (69.6 kg)  05/06/23 154 lb 6.4 oz (70 kg)      Physical Exam Constitutional:      General: She is not in acute distress.    Appearance: Normal appearance. She is not ill-appearing, toxic-appearing or diaphoretic.  HENT:     Head: Normocephalic and atraumatic.     Right Ear: External ear normal.     Left Ear: External ear normal.     Mouth/Throat:     Mouth: Mucous membranes are moist.     Pharynx: Oropharynx is clear. No oropharyngeal exudate or posterior oropharyngeal erythema.  Eyes:     General: No scleral icterus.       Right eye: No discharge.        Left  eye: No discharge.     Extraocular Movements: Extraocular movements intact.     Conjunctiva/sclera: Conjunctivae normal.     Pupils: Pupils are equal, round, and reactive to light.  Cardiovascular:     Rate and Rhythm: Normal rate and regular rhythm.  Pulmonary:     Effort: Pulmonary effort is normal. No respiratory distress.     Breath sounds: Normal breath sounds.  Abdominal:     General: Bowel sounds are normal.     Tenderness: There is no right CVA tenderness or left CVA tenderness.  Musculoskeletal:     Cervical back: No rigidity or tenderness.  Skin:    General: Skin is warm and dry.  Neurological:     Mental Status: She is alert and oriented to person, place, and time.  Psychiatric:        Mood and Affect: Mood normal.        Behavior: Behavior normal.      No results found for any visits on 08/19/23.    The 10-year ASCVD risk score (Arnett DK, et al., 2019) is: 5.4%    Assessment & Plan:   Gastroesophageal reflux disease, unspecified whether esophagitis present -     Ambulatory referral to Gastroenterology  Dysphagia, unspecified type -     Ambulatory referral to Gastroenterology  Irritable bowel syndrome with diarrhea -     Ambulatory referral to Gastroenterology  Essential hypertension    Return Return for follow-up as previously scheduled.  Patient is not interested in taking a PPI.  Mliss Sax, MD

## 2023-08-27 ENCOUNTER — Encounter: Payer: Self-pay | Admitting: Rheumatology

## 2023-08-27 ENCOUNTER — Ambulatory Visit (INDEPENDENT_AMBULATORY_CARE_PROVIDER_SITE_OTHER): Payer: PRIVATE HEALTH INSURANCE | Admitting: Podiatry

## 2023-08-27 ENCOUNTER — Encounter: Payer: Self-pay | Admitting: Podiatry

## 2023-08-27 DIAGNOSIS — R202 Paresthesia of skin: Secondary | ICD-10-CM

## 2023-08-27 DIAGNOSIS — Z8739 Personal history of other diseases of the musculoskeletal system and connective tissue: Secondary | ICD-10-CM | POA: Diagnosis not present

## 2023-08-27 DIAGNOSIS — R200 Anesthesia of skin: Secondary | ICD-10-CM

## 2023-08-27 DIAGNOSIS — M51369 Other intervertebral disc degeneration, lumbar region without mention of lumbar back pain or lower extremity pain: Secondary | ICD-10-CM

## 2023-08-27 NOTE — Progress Notes (Signed)
Subjective:  Patient ID: Tara Avery, female    DOB: Apr 10, 1961,  MRN: [id number]  Chief Complaint  Patient presents with   Toe Pain    Bilateral feet  Pt stated that her toes go numb when she walks     62 y.o. female presents with the above complaint. Patient presents with complaint of bilateral 2 through 4 digit numbness tingling that has been present for quite some time.  She states is mostly bothersome when she is walking she has not seen anyone else prior to seeing me.  She wanted to discuss treatment options.  She has moved recently 5 years ago from [place].  She would like to discuss treatment options if any.  She has not seen a back specialist.  She does have history of back and hip arthritis   Review of Systems: Negative except as noted in the HPI. Denies N/V/F/Ch.  Past Medical History:  Diagnosis Date   Allergy    Arthritis    Chicken pox    Depression    Emphysema of lung (HCC)    when young   Frequent headaches    GERD (gastroesophageal reflux disease)    Hyperlipidemia    Migraine    Urinary tract infection     Current Outpatient Medications:    Alum Hydroxide-Mag Trisilicate (GAVISCON) 80-14.2 MG CHEW, , Disp: , Rfl:    atorvastatin (LIPITOR) 10 MG tablet, TAKE 1 TABLET BY MOUTH EVERY DAY, Disp: 90 tablet, Rfl: 3   calcium carbonate (TUMS - DOSED IN MG ELEMENTAL CALCIUM) 500 MG chewable tablet, Chew 1 tablet by mouth daily., Disp: , Rfl:    cetirizine (ZYRTEC) 10 MG tablet, Take 10 mg by mouth daily., Disp: , Rfl:    Cholecalciferol (VITAMIN D3) 2000 units TABS, Take 1 tablet by mouth daily., Disp: , Rfl:    diphenhydrAMINE (BENADRYL) 25 MG tablet, Take 25 mg by mouth as needed., Disp: , Rfl:    ibuprofen (ADVIL,MOTRIN) 200 MG tablet, Take 200 mg by mouth as needed., Disp: , Rfl:    losartan (COZAAR) 100 MG tablet, TAKE 1 TABLET BY MOUTH EVERY DAY, Disp: 90 tablet, Rfl: 3   MELATONIN ER PO, Take by mouth., Disp: , Rfl:    Multiple Vitamins-Minerals  (CENTRUM WOMEN PO), Take 0.5 tablets by mouth daily., Disp: , Rfl:    Simethicone (GAS-X PO), Take 1 tablet by mouth as needed., Disp: , Rfl:   Social History   Tobacco Use  Smoking Status Never   Passive exposure: Past  Smokeless Tobacco Never    Allergies  Allergen Reactions   Prednisone Palpitations    Patient more recently reported sob while taking it. I consider this to be more of a significant reaction.    Codeine    Latex    Objective:  There were no vitals filed for this visit. There is no height or weight on file to calculate BMI. Constitutional Well developed. Well nourished.  Vascular Dorsalis pedis pulses palpable bilaterally. Posterior tibial pulses palpable bilaterally. Capillary refill normal to all digits.  No cyanosis or clubbing noted. Pedal hair growth normal.  Neurologic Normal speech. Oriented to person, place, and time. Epicritic sensation to light touch grossly present bilaterally.  Dermatologic Nails well groomed and normal in appearance. No open wounds. No skin lesions.  Orthopedic: Numbness tingling noted to bilateral 2 through 4 digits.  Mild hammertoe contractures noted.  Negative tarsal tunnel or common peroneal nerve syndrome.   Radiographs: None Assessment:   1. Numbness and  tingling   2. History of low back pain    Plan:  Patient was evaluated and treated and all questions answered.  Bilateral numbness tingling to digits 2 through 4 with a history of hip and R back arthritis -All questions and concerns were discussed with the patient extensive detail -I discussed sugar modification with her in extensive detail -Ultimately this is likely coming from the lower back/hip given that her digits 2 through 4 is involved.  I discussed this with the patient and she states that she will see back specialist for further management -She would benefit from orthotics as well.  She will be scheduled to see orthotics after she talks to the billing  No  follow-ups on file.

## 2023-08-27 NOTE — Telephone Encounter (Signed)
Please forward message to Dr. Corliss Skains.  I have not seen this patient.

## 2023-08-31 NOTE — Telephone Encounter (Signed)
Patient may schedule an appointment with her neurosurgeon or back specialist.  If she does not have one then we can refer her to [facility].

## 2023-10-13 ENCOUNTER — Encounter: Payer: Self-pay | Admitting: Orthopedic Surgery

## 2023-10-13 ENCOUNTER — Other Ambulatory Visit (INDEPENDENT_AMBULATORY_CARE_PROVIDER_SITE_OTHER): Payer: PRIVATE HEALTH INSURANCE

## 2023-10-13 ENCOUNTER — Ambulatory Visit (INDEPENDENT_AMBULATORY_CARE_PROVIDER_SITE_OTHER): Payer: PRIVATE HEALTH INSURANCE | Admitting: Orthopedic Surgery

## 2023-10-13 VITALS — BP 171/93 | HR 80 | Ht 65.0 in | Wt 155.0 lb

## 2023-10-13 DIAGNOSIS — M545 Low back pain, unspecified: Secondary | ICD-10-CM

## 2023-10-13 DIAGNOSIS — G5762 Lesion of plantar nerve, left lower limb: Secondary | ICD-10-CM | POA: Diagnosis not present

## 2023-10-13 NOTE — Progress Notes (Signed)
Orthopedic Spine Surgery Office Note  Assessment: Patient is a 62 y.o. female with two issues:  1) chronic low back pain 2) left foot numbness/paresthesias in third/fourth toe   Plan: -For her chronic low back pain, explained that she could use tylenol up to 1000mg  TID. She has found ice and heat to be helpful in the past so I told her she can continue to use that as well. We could also do a medrol dose pak if she has a flare of pain -For her foot numbness/paresthesias/pain, I explained that there are a couple of possibilities. I told her that this could represent a neuroma since she has a palpable Mulder's click. It could be neuropathy. It seems less likely to be radiculopathy since it is just in her toes -Could try gabapentin next if she does not get relief with the injection that was done today in the office -Patient should return to office in 4 weeks, x-rays at next visit: none   Injection procedure note: After discussing the risks, benefits, and alternatives of third/fourth webspace steroid injection today in the office, patient elected to proceed. The dorsal skin over the third and fourth toes and webspace was prepped with an alcohol based prep. The skin was anesthetized with ethyl chloride. A 22 gauge needle was to inject 0.25cc of lidocaine, 0.25cc of bupivacaine, and 1cc of depomedrol under standard sterile technique into the space between the third and fourth metatarsal heads. Needle was withdrawn and band aid was applied. Patient tolerated the procedure well.   ___________________________________________________________________________   History:  Patient is a 62 y.o. female who presents today for back and foot pain. Patient has a long history of low back pain. She said she will often note popping and clicking in her back particularly when bending over. Her back pain waxes and wanes in terms of intensity. Most days her pain is tolerable but there are times that it flares up and can  cause her to mostly spend the day resting. There was no trauma or injury that preceded the onset of her low back pain. She has previously had pain that radiates into her legs but she is no longer having any pain radiating into either lower extremity. Has not noticed any lower extremity weakness. No bowel or bladder incontinence. No saddle anesthesia.   She also wanted to talk about foot pain. She has pain in her left foot. She feels it in the third and fourth toes. She only notices when she is walking. She does not have it when sitting down. She also gets numbness/paresthesias in the same toes. She has had this pain for the last several months. She saw a podiatrist who suggested she see a spine surgeon as he felt that her foot pain could represent radiculopathy. There was no trauma or injury that preceded the onset of her foot pain.   Treatments tried: home exercises,NSAIDs  Review of systems: Denies fevers and chills, night sweats, unexplained weight loss, history of cancer. Has had pain that wakes her at night  Past medical history: HLD Psoriasis HTN IBS GERD Anxiety  Allergies: prednisone, codeine, latex  Past surgical history:  Left bunionectomy Hysterectomy  Social history: Denies use of nicotine product (smoking, vaping, patches, smokeless) Alcohol use: denies Denies recreational drug use   Physical Exam:  BMI of 25.8  General: no acute distress, appears stated age Neurologic: alert, answering questions appropriately, following commands Respiratory: unlabored breathing on room air, symmetric chest rise Psychiatric: appropriate affect, normal cadence to speech  MSK (spine):  -Strength exam      Left  Right EHL    -/5  5/5 TA    5/5  5/5 GSC    5/5  5/5 Knee extension  5/5  5/5 Hip flexion   5/5  5/5  Unable to test left EHL due to prior bunion surgery  -Sensory exam    Sensation intact to light touch in L3-S1 nerve distributions of bilateral lower  extremities  -Achilles DTR: 2/4 on the left, 2/4 on the right -Patellar tendon DTR: 2/4 on the left, 2/4 on the right  -Straight leg raise: negative bilaterally -Femoral nerve stretch test: negative bilaterally  -Clonus: no beats bilaterally  -Left hip exam: no pain through range of motion -Right hip exam: no pain through range of motion  Left foot exam: no TTP over the foot, no open wounds or ulcers, palpable mulder's click at the third/fourth webspace, foot warm and well perfused  Imaging: XRs of the lumbar spine from 11/18.2024 were independently reviewed and interpreted, showing lordotic alignment. Disc height loss at L/5SS1. No fracture or dislocation seen. No evidence of instability on flexion/extension views.    Patient name: Tara Avery Patient MRN: [id number] Date of visit: 10/13/23

## 2023-10-27 NOTE — Progress Notes (Unsigned)
Office Visit Note  Patient: Tara Avery             Date of Birth: [birth year]           MRN: [id number]             PCP: Mliss Sax, MD Referring: Mliss Sax,* Visit Date: 11/06/2023 Occupation: @GUAROCC @  Subjective:  No chief complaint on file.   History of Present Illness: Tara Avery is a 62 y.o. female ***     Activities of Daily Living:  Patient reports morning stiffness for *** {minute/hour:19697}.   Patient {ACTIONS;DENIES/REPORTS:21021675::"Denies"} nocturnal pain.  Difficulty dressing/grooming: {ACTIONS;DENIES/REPORTS:21021675::"Denies"} Difficulty climbing stairs: {ACTIONS;DENIES/REPORTS:21021675::"Denies"} Difficulty getting out of chair: {ACTIONS;DENIES/REPORTS:21021675::"Denies"} Difficulty using hands for taps, buttons, cutlery, and/or writing: {ACTIONS;DENIES/REPORTS:21021675::"Denies"}  No Rheumatology ROS completed.   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   History of psoriasis 05/20/2019   History of sarcoidosis 05/20/2019   Allergic rhinitis 05/20/2019   Sciatica of left side 05/20/2019   Arthralgia 05/20/2019   Elevated LDL cholesterol level 01/07/2018   Healthcare maintenance 01/07/2018   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   Vitamin D deficiency 11/14/2008   Menopausal and postmenopausal disorder 09/04/2006   Mixed hyperlipidemia 09/04/2006    Other chronic allergic conjunctivitis 09/04/2006   Other psoriasis 09/04/2006    Past Medical History:  Diagnosis Date   Allergy    Arthritis    Chicken pox    Depression    Emphysema of lung (HCC)    when young   Frequent headaches    GERD (gastroesophageal reflux disease)    Hyperlipidemia    Migraine    Urinary tract infection     Family History  Problem Relation Age of Onset   Alcohol abuse Mother    Arthritis Mother    Depression Mother    Hyperlipidemia Mother    Hypertension Mother    Kidney disease Mother    Alcohol abuse Father    Early death Father    Aneurysm Father    Depression Sister    Diabetes Sister    Heart attack Sister    Hyperlipidemia Sister    Hypertension Sister    Miscarriages / [place] Sister    Stroke Sister    Healthy Daughter    Heart attack Maternal Grandfather    Hypertension Maternal Grandfather    Hyperlipidemia Maternal Grandfather    Arthritis Sister    Past Surgical History:  Procedure Laterality Date   ABDOMINAL HYSTERECTOMY  2002   partial   BUNIONECTOMY Left 06/1996   Social History   Social History Narrative   Not on file   Immunization History  Administered Date(s) Administered   Influenza Inj Mdck Quad Pf 08/13/2022   Influenza, Seasonal, Injecte, Preservative Fre 08/29/2023   Influenza,inj,Quad PF,6+ Mos 10/09/2021   PFIZER Comirnaty(Gray Top)Covid-19 Tri-Sucrose Vaccine 09/03/2020, 03/30/2021   PFIZER(Purple Top)SARS-COV-2 Vaccination 02/04/2020, 02/28/2020, 08/24/2021     Objective: Vital Signs: There were no vitals taken for this  visit.   Physical Exam   Musculoskeletal Exam: ***  CDAI Exam: CDAI Score: -- Patient Global: --; Provider Global: -- Swollen: --; Tender: -- Joint Exam 11/06/2023   No joint exam has been documented for this visit   There is currently no information documented on the homunculus. Go to the Rheumatology activity and complete the homunculus joint  exam.  Investigation: No additional findings.  Imaging: XR Lumbar Spine Complete  Result Date: 10/13/2023 XRs of the lumbar spine from 11/18.2024 were independently reviewed and interpreted, showing lordotic alignment. Disc height loss at L/5SS1. No fracture or dislocation seen. No evidence of instability on flexion/extension views.    Recent Labs: Lab Results  Component Value Date   WBC 6.6 02/12/2022   HGB 13.9 02/12/2022   PLT 256.0 02/12/2022   NA 144 02/20/2023   K 5.0 02/20/2023   CL 107 02/20/2023   CO2 25 02/20/2023   GLUCOSE 85 02/20/2023   BUN 16 02/20/2023   CREATININE 0.85 02/20/2023   BILITOT 0.4 06/03/2022   ALKPHOS 67 06/03/2022   AST 18 06/03/2022   ALT 16 06/03/2022   PROT 6.9 06/03/2022   ALBUMIN 4.2 06/03/2022   CALCIUM 9.4 02/20/2023    Speciality Comments: No specialty comments available.  Procedures:  No procedures performed Allergies: Prednisone, Codeine, and Latex   Assessment / Plan:     Visit Diagnoses: No diagnosis found.  Orders: No orders of the defined types were placed in this encounter.  No orders of the defined types were placed in this encounter.   Face-to-face time spent with patient was *** minutes. Greater than 50% of time was spent in counseling and coordination of care.  Follow-Up Instructions: No follow-ups on file.   Ellen Henri, CMA  Note - This record has been created using Animal nutritionist.  Chart creation errors have been sought, but may not always  have been located. Such creation errors do not reflect on  the standard of medical care.

## 2023-11-06 ENCOUNTER — Ambulatory Visit: Payer: PRIVATE HEALTH INSURANCE | Admitting: Rheumatology

## 2023-11-06 DIAGNOSIS — M8589 Other specified disorders of bone density and structure, multiple sites: Secondary | ICD-10-CM

## 2023-11-06 DIAGNOSIS — Z862 Personal history of diseases of the blood and blood-forming organs and certain disorders involving the immune mechanism: Secondary | ICD-10-CM

## 2023-11-06 DIAGNOSIS — G8929 Other chronic pain: Secondary | ICD-10-CM

## 2023-11-06 DIAGNOSIS — M2242 Chondromalacia patellae, left knee: Secondary | ICD-10-CM

## 2023-11-06 DIAGNOSIS — M47816 Spondylosis without myelopathy or radiculopathy, lumbar region: Secondary | ICD-10-CM

## 2023-11-06 DIAGNOSIS — M4004 Postural kyphosis, thoracic region: Secondary | ICD-10-CM

## 2023-11-06 DIAGNOSIS — I10 Essential (primary) hypertension: Secondary | ICD-10-CM

## 2023-11-06 DIAGNOSIS — L920 Granuloma annulare: Secondary | ICD-10-CM

## 2023-11-06 DIAGNOSIS — E7800 Pure hypercholesterolemia, unspecified: Secondary | ICD-10-CM

## 2023-11-06 DIAGNOSIS — M7062 Trochanteric bursitis, left hip: Secondary | ICD-10-CM

## 2023-11-06 DIAGNOSIS — M19041 Primary osteoarthritis, right hand: Secondary | ICD-10-CM

## 2023-11-06 DIAGNOSIS — L409 Psoriasis, unspecified: Secondary | ICD-10-CM

## 2023-11-06 DIAGNOSIS — M26609 Unspecified temporomandibular joint disorder, unspecified side: Secondary | ICD-10-CM

## 2023-11-06 DIAGNOSIS — G540 Brachial plexus disorders: Secondary | ICD-10-CM

## 2023-11-06 DIAGNOSIS — M19071 Primary osteoarthritis, right ankle and foot: Secondary | ICD-10-CM

## 2023-12-15 IMAGING — DX DG RIBS 2V*L*
3 series · 3 of 3 positions shown · non-contrast
Comparison: 05/20/2019

CLINICAL DATA: Costochondritis.

EXAM:
LEFT RIBS - 2 VIEW

[chest pa]
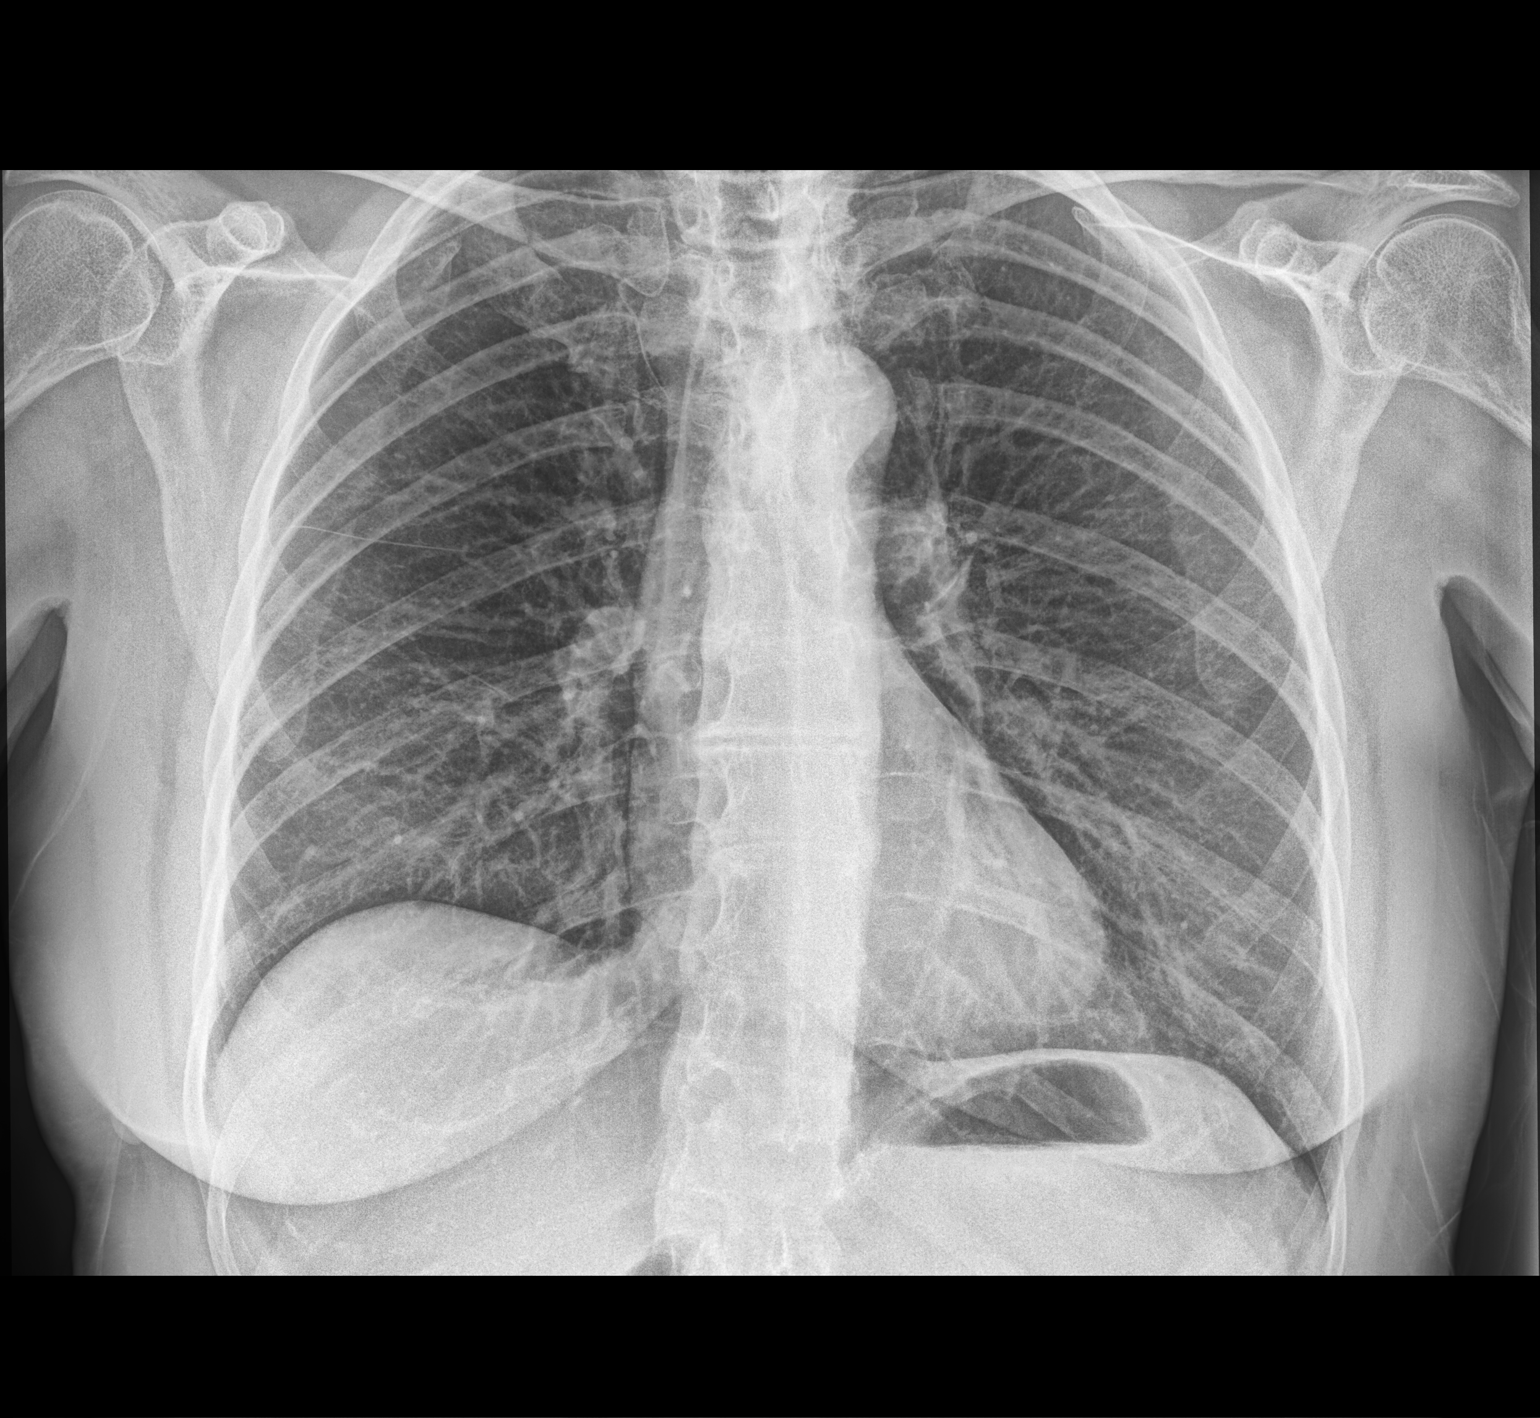

[pa ribs]
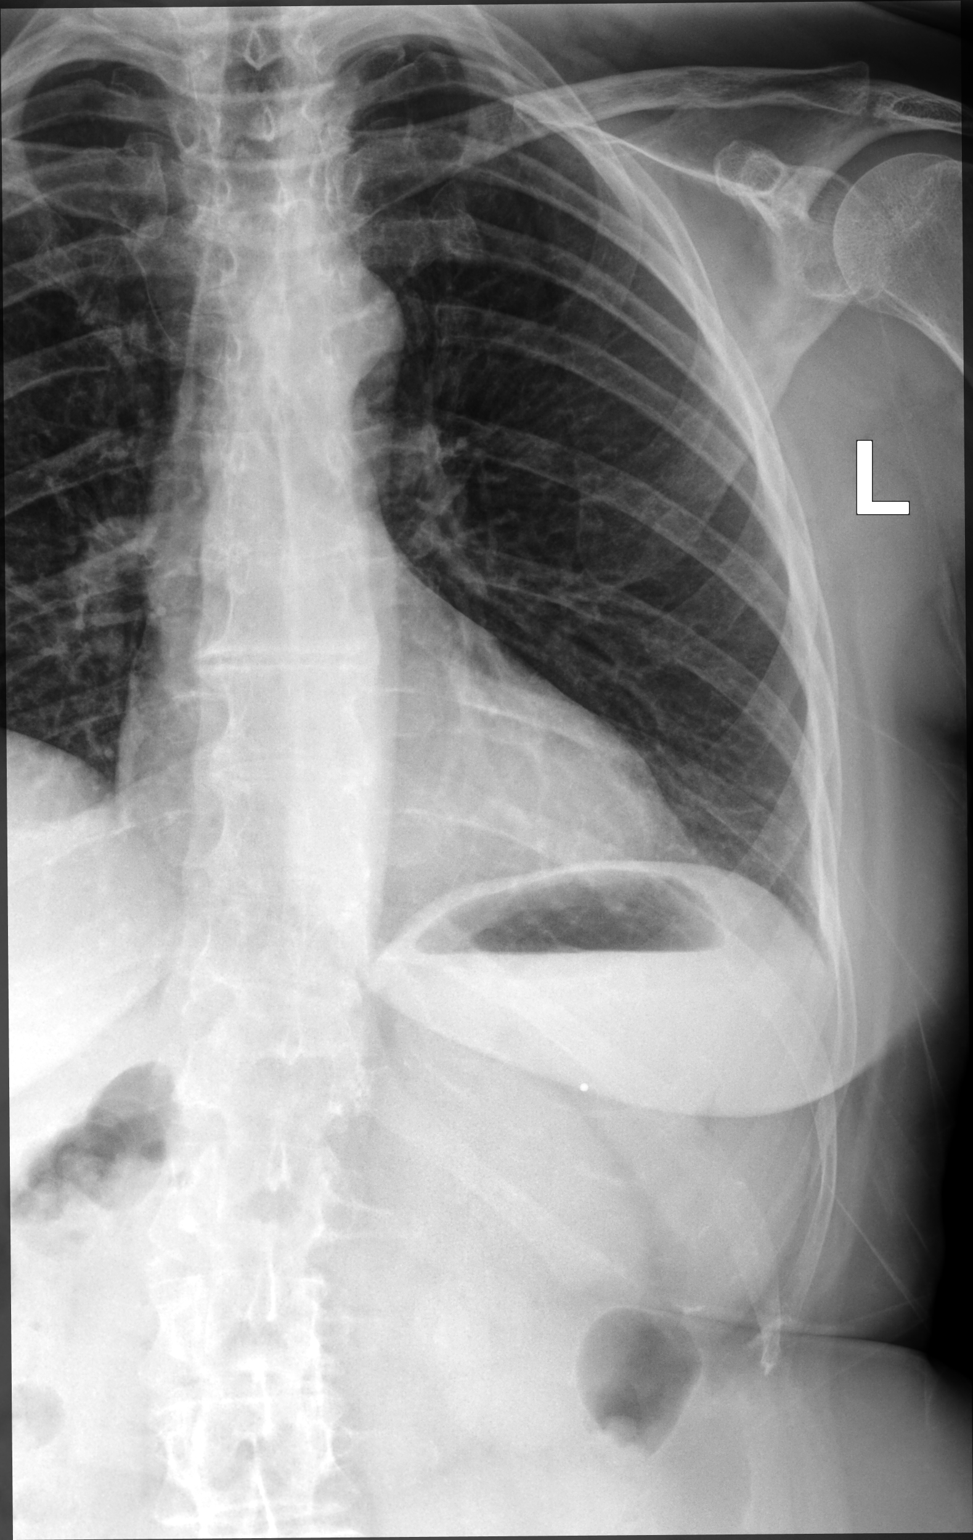

[hemithorax (ribs) pa]
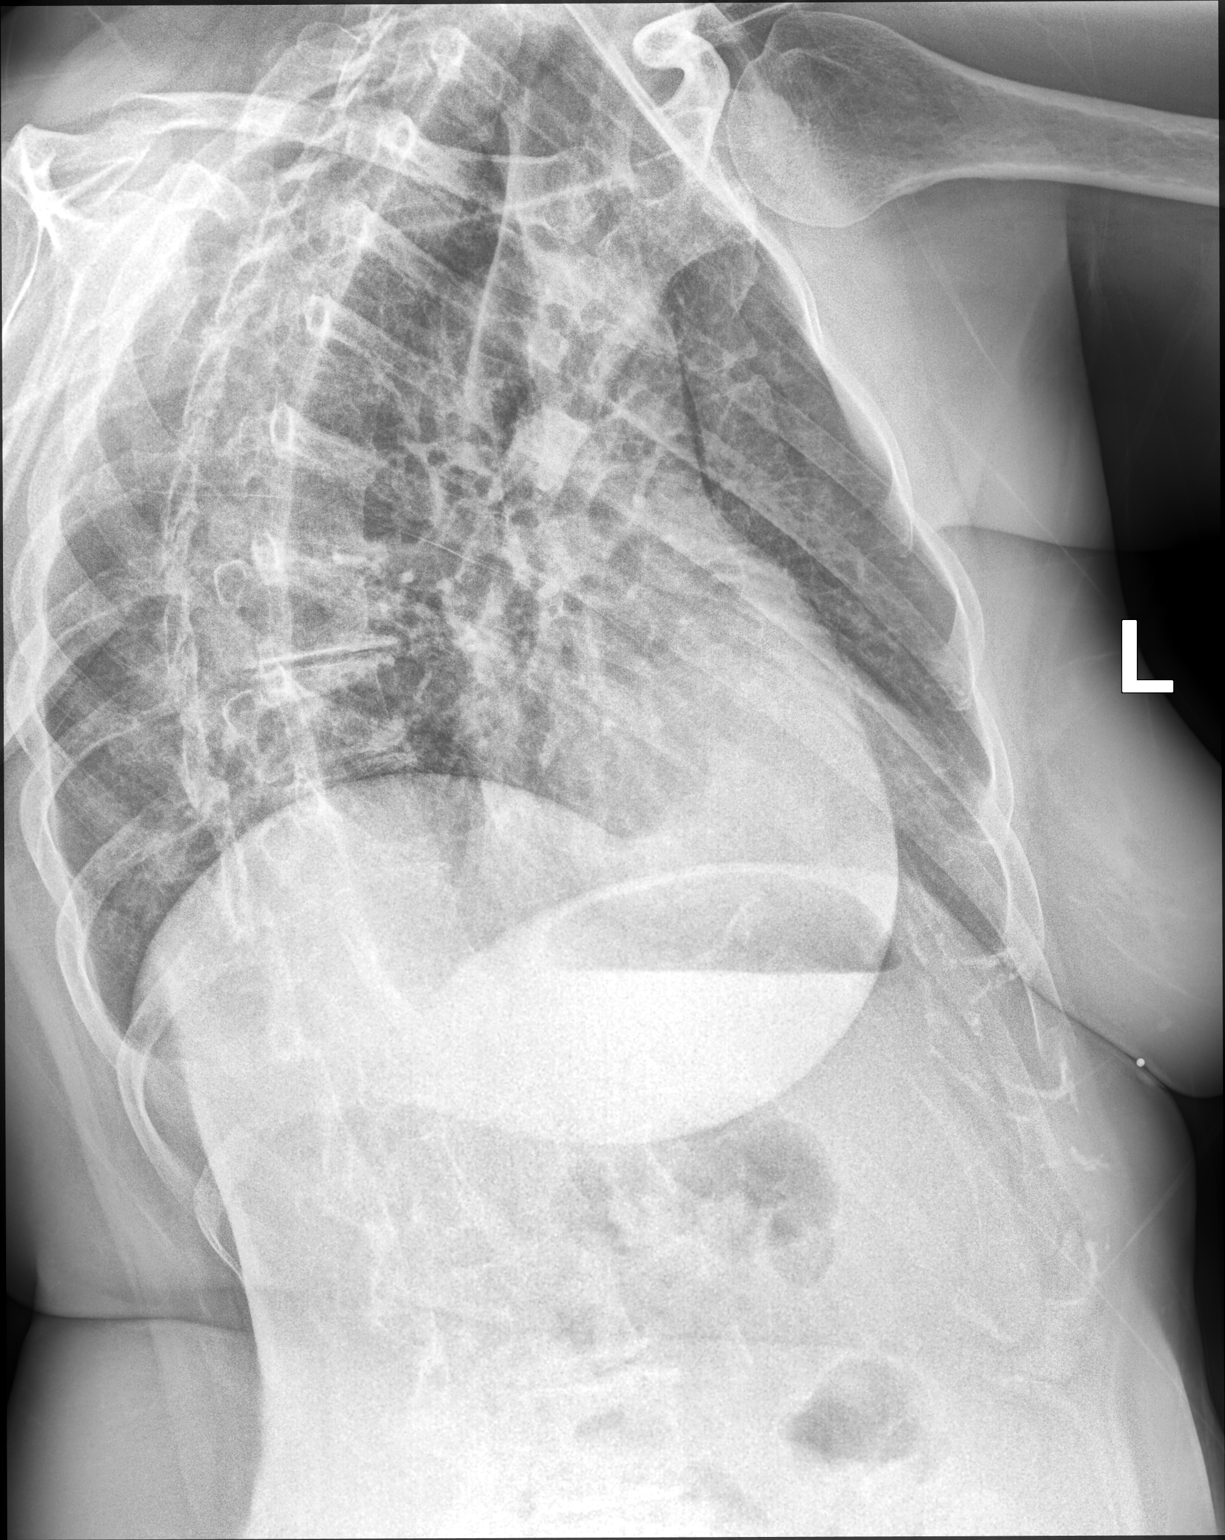

[3 of 3 positions shown; findings below may reference images not displayed]

FINDINGS: Coarse lung markings appear chronic. Heart and mediastinum are
within normal limits. Trachea is midline. BB marker was placed in
the left lower chest. No evidence for a displaced left rib fracture.
IMPRESSION: 1. No acute cardiopulmonary disease.
2. Negative for left rib fracture.

## 2024-01-09 ENCOUNTER — Encounter: Payer: Self-pay | Admitting: Family Medicine

## 2024-01-09 ENCOUNTER — Ambulatory Visit (INDEPENDENT_AMBULATORY_CARE_PROVIDER_SITE_OTHER): Payer: PRIVATE HEALTH INSURANCE | Admitting: Family Medicine

## 2024-01-09 VITALS — BP 140/84 | HR 83 | Temp 97.6°F | Ht 65.0 in | Wt 153.8 lb

## 2024-01-09 DIAGNOSIS — K5901 Slow transit constipation: Secondary | ICD-10-CM

## 2024-01-09 DIAGNOSIS — M545 Low back pain, unspecified: Secondary | ICD-10-CM

## 2024-01-09 DIAGNOSIS — K219 Gastro-esophageal reflux disease without esophagitis: Secondary | ICD-10-CM

## 2024-01-09 DIAGNOSIS — R1011 Right upper quadrant pain: Secondary | ICD-10-CM

## 2024-01-09 LAB — CBC WITH DIFFERENTIAL/PLATELET
Basophils Absolute: 0.1 10*3/uL (ref 0.0–0.1)
Basophils Relative: 0.9 % (ref 0.0–3.0)
Eosinophils Absolute: 0.2 10*3/uL (ref 0.0–0.7)
Eosinophils Relative: 2.1 % (ref 0.0–5.0)
HCT: 39.1 % (ref 36.0–46.0)
Hemoglobin: 13.1 g/dL (ref 12.0–15.0)
Lymphocytes Relative: 26.7 % (ref 12.0–46.0)
Lymphs Abs: 2.1 10*3/uL (ref 0.7–4.0)
MCHC: 33.5 g/dL (ref 30.0–36.0)
MCV: 90.2 fL (ref 78.0–100.0)
Monocytes Absolute: 0.6 10*3/uL (ref 0.1–1.0)
Monocytes Relative: 7.8 % (ref 3.0–12.0)
Neutro Abs: 4.9 10*3/uL (ref 1.4–7.7)
Neutrophils Relative %: 62.5 % (ref 43.0–77.0)
Platelets: 368 10*3/uL (ref 150.0–400.0)
RBC: 4.34 Mil/uL (ref 3.87–5.11)
RDW: 12.2 % (ref 11.5–15.5)
WBC: 7.8 10*3/uL (ref 4.0–10.5)

## 2024-01-09 LAB — URINALYSIS, ROUTINE W REFLEX MICROSCOPIC
Bilirubin Urine: NEGATIVE
Ketones, ur: NEGATIVE
Leukocytes,Ua: NEGATIVE
Nitrite: NEGATIVE
Specific Gravity, Urine: <=1.005 — AB (ref 1.000–1.030)
Total Protein, Urine: NEGATIVE
Urine Glucose: NEGATIVE
Urobilinogen, UA: 0.2 (ref 0.0–1.0)
pH: 6 (ref 5.0–8.0)

## 2024-01-09 LAB — COMPREHENSIVE METABOLIC PANEL
ALT: 12 U/L (ref 0–35)
AST: 17 U/L (ref 0–37)
Albumin: 4.1 g/dL (ref 3.5–5.2)
Alkaline Phosphatase: 92 U/L (ref 39–117)
BUN: 9 mg/dL (ref 6–23)
CO2: 28 meq/L (ref 19–32)
Calcium: 9 mg/dL (ref 8.4–10.5)
Chloride: 100 meq/L (ref 96–112)
Creatinine, Ser: 0.72 mg/dL (ref 0.40–1.20)
GFR: 88.52 mL/min (ref >60.00)
Glucose, Bld: 88 mg/dL (ref 70–99)
Potassium: 4 meq/L (ref 3.5–5.1)
Sodium: 136 meq/L (ref 135–145)
Total Bilirubin: 0.4 mg/dL (ref 0.2–1.2)
Total Protein: 7.7 g/dL (ref 6.0–8.3)

## 2024-01-09 LAB — LIPASE: Lipase: 18 U/L (ref 11.0–59.0)

## 2024-01-09 LAB — AMYLASE: Amylase: 23 U/L — ABNORMAL LOW (ref 27–131)

## 2024-01-09 MED ORDER — POLYETHYLENE GLYCOL 3350 17 GM/SCOOP PO POWD: 17.0000 g | Freq: Every day | ORAL | 0 refills | Status: AC

## 2024-01-09 MED ORDER — OMEPRAZOLE 20 MG PO CPDR: 20.0000 mg | DELAYED_RELEASE_CAPSULE | Freq: Every day | ORAL | 3 refills | Status: DC

## 2024-01-09 NOTE — Progress Notes (Signed)
Established Patient Office Visit   Subjective:  Patient ID: Tara Avery, female    DOB: 30-Mar-1961  Age: 63 y.o. MRN: [id number]  Chief Complaint  Patient presents with   Abdominal Pain    Abdominal pain and bloating x 2 years but has increased in that past 2 months. Pt has family hx of gallbladder surgery.     Abdominal Pain Associated symptoms include constipation. Pertinent negatives include no melena or myalgias.   Encounter Diagnoses  Name Primary?   Right upper quadrant abdominal pain Yes   Bilateral low back pain without sciatica, unspecified chronicity    Gastroesophageal reflux disease, unspecified whether esophagitis present    Slow transit constipation    Presents with a 79-month history of gradually worsening right upper quadrant pain seems to be worse after having meal.  She has also noticed an increase in reflux signs and symptoms.  She had been taking Tums but is recently started taking Gaviscon.  There have been no fevers chills, weight loss or night sweats.  She has been having problems with constipation over the last 3 months.  Her hemorrhoids have become more of a problem.  She has had occasional incontinence of urine and stool.  She sometimes urinates before she is fully squatted on the toilet seat.  She occasionally has bowel incontinence.  She is being seen for chronic lower back pain with intermittent sciatica by an orthopedist.  She has a strong family history of gallbladder disease in her mother and sisters.  Denies right shoulder pain   Review of Systems  Constitutional: Negative.   HENT: Negative.    Eyes:  Negative for blurred vision, discharge and redness.  Respiratory: Negative.    Cardiovascular: Negative.   Gastrointestinal:  Positive for abdominal pain, constipation and heartburn. Negative for blood in stool and melena.  Genitourinary: Negative.   Musculoskeletal:  Positive for back pain. Negative for myalgias.  Skin:  Negative for rash.   Neurological:  Positive for tingling. Negative for loss of consciousness and weakness.  Endo/Heme/Allergies:  Negative for polydipsia.     Current Outpatient Medications:    Alum Hydroxide-Mag Trisilicate (GAVISCON) 80-14.2 MG CHEW, , Disp: , Rfl:    atorvastatin (LIPITOR) 10 MG tablet, TAKE 1 TABLET BY MOUTH EVERY DAY, Disp: 90 tablet, Rfl: 3   cetirizine (ZYRTEC) 10 MG tablet, Take 10 mg by mouth daily., Disp: , Rfl:    Cholecalciferol (VITAMIN D3) 2000 units TABS, Take 1 tablet by mouth daily., Disp: , Rfl:    diphenhydrAMINE (BENADRYL) 25 MG tablet, Take 25 mg by mouth as needed., Disp: , Rfl:    ibuprofen (ADVIL,MOTRIN) 200 MG tablet, Take 200 mg by mouth as needed., Disp: , Rfl:    losartan (COZAAR) 100 MG tablet, TAKE 1 TABLET BY MOUTH EVERY DAY, Disp: 90 tablet, Rfl: 3   MELATONIN ER PO, Take by mouth., Disp: , Rfl:    Multiple Vitamins-Minerals (CENTRUM WOMEN PO), Take 0.5 tablets by mouth daily., Disp: , Rfl:    omeprazole (PRILOSEC) 20 MG capsule, Take 1 capsule (20 mg total) by mouth daily., Disp: 30 capsule, Rfl: 3   polyethylene glycol powder (GLYCOLAX/MIRALAX) 17 GM/SCOOP powder, Take 17 g by mouth daily. As needed for constipation, Disp: 850 g, Rfl: 0   Simethicone (GAS-X PO), Take 1 tablet by mouth as needed., Disp: , Rfl:    calcium carbonate (TUMS - DOSED IN MG ELEMENTAL CALCIUM) 500 MG chewable tablet, Chew 1 tablet by mouth daily. (Patient not taking: Reported  on 01/09/2024), Disp: , Rfl:    Objective:     BP (!) 140/84   Pulse 83   Temp 97.6 F (36.4 C)   Ht 5\' 5"  (1.651 m)   Wt 153 lb 12.8 oz (69.8 kg)   SpO2 98%   BMI 25.59 kg/m  BP Readings from Last 3 Encounters:  01/09/24 (!) 140/84  10/13/23 (!) 171/93  08/19/23 128/84   Wt Readings from Last 3 Encounters:  01/09/24 153 lb 12.8 oz (69.8 kg)  10/13/23 155 lb (70.3 kg)  08/19/23 154 lb 6.4 oz (70 kg)      Physical Exam Constitutional:      General: She is not in acute distress.    Appearance:  Normal appearance. She is not ill-appearing, toxic-appearing or diaphoretic.  HENT:     Head: Normocephalic and atraumatic.     Right Ear: External ear normal.     Left Ear: External ear normal.     Mouth/Throat:     Mouth: Mucous membranes are moist.     Pharynx: Oropharynx is clear. No oropharyngeal exudate or posterior oropharyngeal erythema.  Eyes:     General: No scleral icterus.       Right eye: No discharge.        Left eye: No discharge.     Extraocular Movements: Extraocular movements intact.     Conjunctiva/sclera: Conjunctivae normal.     Pupils: Pupils are equal, round, and reactive to light.  Cardiovascular:     Rate and Rhythm: Normal rate and regular rhythm.  Pulmonary:     Effort: Pulmonary effort is normal. No respiratory distress.     Breath sounds: Normal breath sounds. No wheezing or rales.  Abdominal:     General: Abdomen is flat. Bowel sounds are normal.     Palpations: Abdomen is soft.     Tenderness: There is abdominal tenderness in the right upper quadrant. There is guarding. There is no right CVA tenderness or left CVA tenderness.  Musculoskeletal:     Cervical back: No rigidity or tenderness.  Skin:    General: Skin is warm and dry.  Neurological:     Mental Status: She is alert and oriented to person, place, and time.  Psychiatric:        Mood and Affect: Mood normal.        Behavior: Behavior normal.      No results found for any visits on 01/09/24.    The 10-year ASCVD risk score (Arnett DK, et al., 2019) is: 7.2%    Assessment & Plan:   Right upper quadrant abdominal pain -     Amylase -     CBC with Differential/Platelet -     Comprehensive metabolic panel -     Lipase -     Urinalysis, Routine w reflex microscopic -     US Abdomen Limited; Future  Bilateral low back pain without sciatica, unspecified chronicity -     Ambulatory referral to Orthopedic Surgery  Gastroesophageal reflux disease, unspecified whether esophagitis  present -     Omeprazole; Take 1 capsule (20 mg total) by mouth daily.  Dispense: 30 capsule; Refill: 3  Slow transit constipation -     Polyethylene Glycol 3350; Take 17 g by mouth daily. As needed for constipation  Dispense: 850 g; Refill: 0    Return in about 4 weeks (around 02/06/2024), or if symptoms worsen or fail to improve.    Mliss Sax, MD

## 2024-01-10 ENCOUNTER — Encounter: Payer: Self-pay | Admitting: Family Medicine

## 2024-01-13 ENCOUNTER — Encounter: Payer: Self-pay | Admitting: Family Medicine

## 2024-01-18 ENCOUNTER — Ambulatory Visit (HOSPITAL_BASED_OUTPATIENT_CLINIC_OR_DEPARTMENT_OTHER)
Admission: RE | Admit: 2024-01-18 | Discharge: 2024-01-18 | Disposition: A | Discharge status: Home / Self Care | Payer: PRIVATE HEALTH INSURANCE | Source: Ambulatory Visit | Attending: Family Medicine | Admitting: Family Medicine

## 2024-01-18 DIAGNOSIS — R1011 Right upper quadrant pain: Secondary | ICD-10-CM | POA: Diagnosis present

## 2024-01-20 DIAGNOSIS — R1011 Right upper quadrant pain: Secondary | ICD-10-CM

## 2024-01-28 ENCOUNTER — Encounter: Payer: Self-pay | Admitting: Family Medicine

## 2024-02-02 ENCOUNTER — Ambulatory Visit
Admission: RE | Admit: 2024-02-02 | Discharge: 2024-02-02 | Disposition: A | Discharge status: Home / Self Care | Payer: PRIVATE HEALTH INSURANCE | Source: Ambulatory Visit | Attending: Family Medicine | Admitting: Family Medicine

## 2024-02-02 DIAGNOSIS — R1011 Right upper quadrant pain: Secondary | ICD-10-CM

## 2024-02-02 MED ORDER — IOPAMIDOL (ISOVUE-300) INJECTION 61%
100.0000 mL | Freq: Once | INTRAVENOUS | Status: AC | PRN
  Administered 2024-02-02: 100 mL via INTRAVENOUS

## 2024-02-04 ENCOUNTER — Ambulatory Visit: Payer: PRIVATE HEALTH INSURANCE | Admitting: Orthopedic Surgery

## 2024-02-18 ENCOUNTER — Ambulatory Visit (INDEPENDENT_AMBULATORY_CARE_PROVIDER_SITE_OTHER): Payer: PRIVATE HEALTH INSURANCE | Admitting: Orthopedic Surgery

## 2024-02-18 DIAGNOSIS — M5416 Radiculopathy, lumbar region: Secondary | ICD-10-CM

## 2024-02-18 NOTE — Progress Notes (Addendum)
 Orthopedic Spine Surgery Office Note   Assessment: Patient is a 63 y.o. female with two issues:   1) Chronic low back pain with intermittent radicular type pain down the left lower extremity.  Has significant degenerative disc at L4/5 and for intermittent radicular pain seems to be an L5 distribution 2) Bilateral knee pain     Plan: -Patient does not have any incontinence, saddle anesthesia, pain radiating to her legs, or weakness so I do not have any suspicion for cauda equina at this time -Her back pain comes and goes.  Right now it is not so bad so I told her to continue with ice and heat and Tylenol.  If he gets worse or she has a flare of pain, could consider a Medrol Dosepak -If her back pain persist, I would also then obtain an MRI -She did not respond to an injection in her foot for the numbness and paresthesias in her toes, so would not repeat in the future -In regards to her knees, they are currently doing better.  I told her that she can continue with bracing and ice.  If it comes back, we could consider an injection -Patient should return to office on an as-needed basis   ___________________________________________________________________________     History:   Patient is a 63 y.o. female who presents today for low back pain and bilateral knee pain.  She states that she is still having low back pain.  She will occasionally notice popping and clicking in the back.  She does not notice this on a daily basis.  She notes that sometimes particularly when bending over to get something.  She has some days where her pain is minimal and other days where its moderate in nature.  She is not having any pain radiating into her legs today.  She said at times she will have pain shooting down the left leg along the lateral aspect of the thigh and leg.  She has not noticed any pain radiating into her right lower extremity.  She has had episodes where she cannot get to the toilet in time.  She says  she always knows that she has to go to the bathroom but sometimes when she is going to sit down to urinate some urine will leak out.  She has not had any fecal incontinence.  She has had increased frequency with needing to have a bowel movement. She has had these symptoms for several months.  She has no numbness in her groin.  She has not noticed any weakness in her legs.    She also said she recently had pretty significant right knee pain.  She said the knee was locking up on her.  She noticed swelling especially around the anterior lateral aspect of the knee.  There was no trauma or injury that preceded the onset of pain.  She says she is been using a brace and icing and the pain is gotten significantly better.  However, as she was compensating with her left, she developed pain in the left knee.  She said that has also gotten better.   Treatments tried: home exercises, NSAIDs, Tylenol   Physical Exam:   General: no acute distress, appears stated age Neurologic: alert, answering questions appropriately, following commands Respiratory: unlabored breathing on room air, symmetric chest rise Psychiatric: appropriate affect, normal cadence to speech     MSK (spine):   -Strength exam  Left                  Right EHL                              -/5                   5/5 TA                                 5/5                  5/5 GSC                             5/5                  5/5 Knee extension            5/5                  5/5 Hip flexion                    5/5                  5/5   Unable to test left EHL due to prior bunion surgery   -Sensory exam                           Sensation intact to light touch in L3-S1 nerve distributions of bilateral lower extremities.  No saddle anesthesia   -Achilles DTR: 2/4 on the left, 2/4 on the right -Patellar tendon DTR: 2/4 on the left, 2/4 on the right   -Full painless knee range of motion  bilaterally   Imaging: XRs of the lumbar spine from 11/18.2024 were previously independently reviewed and interpreted, showing lordotic alignment. Disc height loss at L/5SS1. No fracture or dislocation seen. No evidence of instability on flexion/extension views.    CT scan of the abdomen/pelvis from 02/02/2024 was reviewed and showed vacuum disc phenomenon at L4/5.  Disc height loss at L2/3 with endplate sclerosis on the inferior endplate of L2.  No fracture or dislocation seen.    Patient name: Tara Avery Patient MRN: [id number] Date of visit: 02/18/24  I spent in the room with the patient. She explained that there was some concern about cauda equina so I spent significant time going over her symptoms and performing exam. We also talked about her foot injection and bilateral knee pain. We went over concerning symptoms that should prompt a call to the office or presentation to the ER. I also spent some time going over her CT scan and the degenerative disc that likely has stenosis behind that area causing radiculopathy.

## 2024-04-10 ENCOUNTER — Other Ambulatory Visit: Payer: Self-pay | Admitting: Family Medicine

## 2024-04-10 DIAGNOSIS — E7800 Pure hypercholesterolemia, unspecified: Secondary | ICD-10-CM

## 2024-04-12 MED ORDER — ATORVASTATIN CALCIUM 10 MG PO TABS: 10.0000 mg | ORAL_TABLET | Freq: Every day | ORAL | 3 refills | Status: AC

## 2024-04-12 NOTE — Addendum Note (Signed)
 Addended by: Delene Feinstein on: 04/12/2024 10:32 AM   Modules accepted: Orders

## 2024-04-22 NOTE — Progress Notes (Deleted)
 Office Visit Note  Patient: Tara Avery             Date of Birth: 1961-11-17           MRN: [id number]             PCP: Tonna Frederic, MD Referring: Tonna Frederic,* Visit Date: 05/05/2024 Occupation: @GUAROCC @  Subjective:  No chief complaint on file.   History of Present Illness: Tara Avery is a 62 y.o. female ***     Activities of Daily Living:  Patient reports morning stiffness for *** {minute/hour:19697}.   Patient {ACTIONS;DENIES/REPORTS:21021675::"Denies"} nocturnal pain.  Difficulty dressing/grooming: {ACTIONS;DENIES/REPORTS:21021675::"Denies"} Difficulty climbing stairs: {ACTIONS;DENIES/REPORTS:21021675::"Denies"} Difficulty getting out of chair: {ACTIONS;DENIES/REPORTS:21021675::"Denies"} Difficulty using hands for taps, buttons, cutlery, and/or writing: {ACTIONS;DENIES/REPORTS:21021675::"Denies"}  No Rheumatology ROS completed.   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   History of psoriasis 05/20/2019   History of sarcoidosis 05/20/2019   Allergic rhinitis 05/20/2019   Sciatica of left side 05/20/2019   Arthralgia 05/20/2019   Elevated LDL cholesterol level 01/07/2018   Healthcare maintenance 01/07/2018   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   Vitamin D  deficiency 11/14/2008   Menopausal and postmenopausal disorder 09/04/2006   Mixed hyperlipidemia 09/04/2006    Other chronic allergic conjunctivitis 09/04/2006   Other psoriasis 09/04/2006    Past Medical History:  Diagnosis Date   Allergy    Arthritis    Chicken pox    Depression    Emphysema of lung (HCC)    when young   Frequent headaches    GERD (gastroesophageal reflux disease)    Hyperlipidemia    Migraine    Urinary tract infection     Family History  Problem Relation Age of Onset   Alcohol abuse Mother    Arthritis Mother    Depression Mother    Hyperlipidemia Mother    Hypertension Mother    Kidney disease Mother    Alcohol abuse Father    Early death Father    Aneurysm Father    Depression Sister    Diabetes Sister    Heart attack Sister    Hyperlipidemia Sister    Hypertension Sister    Miscarriages / [place] Sister    Stroke Sister    Healthy Daughter    Heart attack Maternal Grandfather    Hypertension Maternal Grandfather    Hyperlipidemia Maternal Grandfather    Arthritis Sister    Past Surgical History:  Procedure Laterality Date   ABDOMINAL HYSTERECTOMY  2002   partial   BUNIONECTOMY Left 06/1996   Social History   Social History Narrative   Not on file   Immunization History  Administered Date(s) Administered   Influenza Inj Mdck Quad Pf 08/13/2022   Influenza, Seasonal, Injecte, Preservative Fre 08/29/2023   Influenza,inj,Quad PF,6+ Mos 10/09/2021   PFIZER Comirnaty(Gray Top)Covid-19 Tri-Sucrose Vaccine 09/03/2020, 03/30/2021   PFIZER(Purple Top)SARS-COV-2 Vaccination 02/04/2020, 02/28/2020, 08/24/2021     Objective: Vital Signs: There were no vitals taken for this  visit.   Physical Exam   Musculoskeletal Exam: ***  CDAI Exam: CDAI Score: -- Patient Global: --; Provider Global: -- Swollen: --; Tender: -- Joint Exam 05/05/2024   No joint exam has been documented for this visit   There is currently no information documented on the homunculus. Go to the Rheumatology activity and complete the homunculus joint  exam.  Investigation: No additional findings.  Imaging: No results found.  Recent Labs: Lab Results  Component Value Date   WBC 7.8 01/09/2024   HGB 13.1 01/09/2024   PLT 368.0 01/09/2024   NA 136 01/09/2024   K 4.0 01/09/2024   CL 100 01/09/2024   CO2 28 01/09/2024   GLUCOSE 88 01/09/2024   BUN 9 01/09/2024   CREATININE 0.72 01/09/2024   BILITOT 0.4 01/09/2024   ALKPHOS 92 01/09/2024   AST 17 01/09/2024   ALT 12 01/09/2024   PROT 7.7 01/09/2024   ALBUMIN 4.1 01/09/2024   CALCIUM  9.0 01/09/2024    Speciality Comments: No specialty comments available.  Procedures:  No procedures performed Allergies: Prednisone , Codeine, and Latex   Assessment / Plan:     Visit Diagnoses: No diagnosis found.  Orders: No orders of the defined types were placed in this encounter.  No orders of the defined types were placed in this encounter.   Face-to-face time spent with patient was *** minutes. Greater than 50% of time was spent in counseling and coordination of care.  Follow-Up Instructions: No follow-ups on file.   Dee Farber, CMA  Note - This record has been created using Animal nutritionist.  Chart creation errors have been sought, but may not always  have been located. Such creation errors do not reflect on  the standard of medical care.

## 2024-05-05 ENCOUNTER — Ambulatory Visit: Payer: PRIVATE HEALTH INSURANCE | Admitting: Rheumatology

## 2024-05-05 DIAGNOSIS — I10 Essential (primary) hypertension: Secondary | ICD-10-CM

## 2024-05-05 DIAGNOSIS — M19071 Primary osteoarthritis, right ankle and foot: Secondary | ICD-10-CM

## 2024-05-05 DIAGNOSIS — M4004 Postural kyphosis, thoracic region: Secondary | ICD-10-CM

## 2024-05-05 DIAGNOSIS — M19041 Primary osteoarthritis, right hand: Secondary | ICD-10-CM

## 2024-05-05 DIAGNOSIS — E7800 Pure hypercholesterolemia, unspecified: Secondary | ICD-10-CM

## 2024-05-05 DIAGNOSIS — M2241 Chondromalacia patellae, right knee: Secondary | ICD-10-CM

## 2024-05-05 DIAGNOSIS — M7062 Trochanteric bursitis, left hip: Secondary | ICD-10-CM

## 2024-05-05 DIAGNOSIS — M47816 Spondylosis without myelopathy or radiculopathy, lumbar region: Secondary | ICD-10-CM

## 2024-05-05 DIAGNOSIS — Z862 Personal history of diseases of the blood and blood-forming organs and certain disorders involving the immune mechanism: Secondary | ICD-10-CM

## 2024-05-05 DIAGNOSIS — L920 Granuloma annulare: Secondary | ICD-10-CM

## 2024-05-05 DIAGNOSIS — G8929 Other chronic pain: Secondary | ICD-10-CM

## 2024-05-05 DIAGNOSIS — M26609 Unspecified temporomandibular joint disorder, unspecified side: Secondary | ICD-10-CM

## 2024-05-05 DIAGNOSIS — M8589 Other specified disorders of bone density and structure, multiple sites: Secondary | ICD-10-CM

## 2024-05-05 DIAGNOSIS — G540 Brachial plexus disorders: Secondary | ICD-10-CM

## 2024-05-05 DIAGNOSIS — L409 Psoriasis, unspecified: Secondary | ICD-10-CM

## 2024-06-14 ENCOUNTER — Ambulatory Visit (INDEPENDENT_AMBULATORY_CARE_PROVIDER_SITE_OTHER): Payer: PRIVATE HEALTH INSURANCE | Admitting: Family Medicine

## 2024-06-14 VITALS — BP 124/76 | HR 80 | Temp 97.9°F | Ht 65.0 in | Wt 147.2 lb

## 2024-06-14 DIAGNOSIS — L2082 Flexural eczema: Secondary | ICD-10-CM | POA: Diagnosis not present

## 2024-06-14 DIAGNOSIS — D229 Melanocytic nevi, unspecified: Secondary | ICD-10-CM | POA: Diagnosis not present

## 2024-06-14 DIAGNOSIS — R935 Abnormal findings on diagnostic imaging of other abdominal regions, including retroperitoneum: Secondary | ICD-10-CM | POA: Diagnosis not present

## 2024-06-14 MED ORDER — TRIAMCINOLONE ACETONIDE 0.1 % EX OINT: 1.0000 | TOPICAL_OINTMENT | Freq: Two times a day (BID) | CUTANEOUS | 0 refills | Status: AC

## 2024-06-14 NOTE — Progress Notes (Signed)
 Established Patient Office Visit   Subjective:  Patient ID: Tara Avery, female    DOB: 06/06/1961  Age: 63 y.o. MRN: [id number]  Chief Complaint  Patient presents with   Rash    Rash on bilateral arms. Pt has a hx of psoriasis. No current treatment. Red, raised and itchy.     Rash   Encounter Diagnoses  Name Primary?   Abnormal CT scan, pelvis Yes   Flexural eczema    Atypical nevi    Here for rash on her right forearm and left antecubital fossa that has been present over the last few months.  It has not changed..  The latter is pruritic at times.  No history of eczema.  Has not had follow-up of abnormality of adnexa on the left.   Review of Systems  Skin:  Positive for itching and rash.     Current Outpatient Medications:    Alum Hydroxide-Mag Trisilicate (GAVISCON) 80-14.2 MG CHEW, , Disp: , Rfl:    atorvastatin  (LIPITOR) 10 MG tablet, Take 1 tablet (10 mg total) by mouth daily., Disp: 90 tablet, Rfl: 3   calcium  carbonate (TUMS - DOSED IN MG ELEMENTAL CALCIUM ) 500 MG chewable tablet, Chew 1 tablet by mouth daily., Disp: , Rfl:    cetirizine (ZYRTEC) 10 MG tablet, Take 10 mg by mouth daily., Disp: , Rfl:    Cholecalciferol (VITAMIN D3) 2000 units TABS, Take 1 tablet by mouth daily., Disp: , Rfl:    diphenhydrAMINE (BENADRYL) 25 MG tablet, Take 25 mg by mouth as needed., Disp: , Rfl:    ibuprofen (ADVIL,MOTRIN) 200 MG tablet, Take 200 mg by mouth as needed., Disp: , Rfl:    losartan  (COZAAR ) 100 MG tablet, TAKE 1 TABLET BY MOUTH EVERY DAY, Disp: 90 tablet, Rfl: 3   MELATONIN ER PO, Take by mouth., Disp: , Rfl:    Multiple Vitamins-Minerals (CENTRUM WOMEN PO), Take 0.5 tablets by mouth daily., Disp: , Rfl:    omeprazole  (PRILOSEC) 20 MG capsule, Take 1 capsule (20 mg total) by mouth daily., Disp: 30 capsule, Rfl: 3   polyethylene glycol powder (GLYCOLAX /MIRALAX ) 17 GM/SCOOP powder, Take 17 g by mouth daily. As needed for constipation, Disp: 850 g, Rfl: 0   Simethicone  (GAS-X PO), Take 1 tablet by mouth as needed., Disp: , Rfl:    triamcinolone  ointment (KENALOG ) 0.1 %, Apply 1 Application topically 2 (two) times daily., Disp: 30 g, Rfl: 0   Objective:     BP 124/76 (Cuff Size: Normal)   Pulse 80   Temp 97.9 F (36.6 C) (Temporal)   Ht 5' 5 (1.651 m)   Wt 147 lb 3.2 oz (66.8 kg)   SpO2 96%   BMI 24.50 kg/m    Physical Exam Constitutional:      General: She is not in acute distress.    Appearance: Normal appearance. She is not ill-appearing, toxic-appearing or diaphoretic.  HENT:     Head: Normocephalic and atraumatic.     Right Ear: External ear normal.     Left Ear: External ear normal.  Eyes:     General: No scleral icterus.       Right eye: No discharge.        Left eye: No discharge.     Extraocular Movements: Extraocular movements intact.     Conjunctiva/sclera: Conjunctivae normal.  Pulmonary:     Effort: Pulmonary effort is normal. No respiratory distress.  Skin:    General: Skin is warm and dry.  Neurological:     Mental Status: She is alert and oriented to person, place, and time.  Psychiatric:        Mood and Affect: Mood normal.        Behavior: Behavior normal.      No results found for any visits on 06/14/24.    The 10-year ASCVD risk score (Arnett DK, et al., 2019) is: 5.6%    Assessment & Plan:   Abnormal CT scan, pelvis -     Ambulatory referral to Gynecology  Flexural eczema -     Triamcinolone  Acetonide; Apply 1 Application topically 2 (two) times daily.  Dispense: 30 g; Refill: 0  Atypical nevi -     Ambulatory referral to Dermatology    Return in about 3 months (around 09/14/2024) for annual physical, chronic disease follow-up. Patient declines referral for mammogram.    Elsie Sim Lent, MD

## 2024-06-30 ENCOUNTER — Ambulatory Visit: Payer: PRIVATE HEALTH INSURANCE | Admitting: Dermatology

## 2024-06-30 VITALS — BP 151/75

## 2024-06-30 DIAGNOSIS — L821 Other seborrheic keratosis: Secondary | ICD-10-CM

## 2024-06-30 DIAGNOSIS — D485 Neoplasm of uncertain behavior of skin: Secondary | ICD-10-CM

## 2024-06-30 DIAGNOSIS — D225 Melanocytic nevi of trunk: Secondary | ICD-10-CM | POA: Diagnosis not present

## 2024-06-30 DIAGNOSIS — L309 Dermatitis, unspecified: Secondary | ICD-10-CM | POA: Diagnosis not present

## 2024-06-30 NOTE — Patient Instructions (Signed)

## 2024-06-30 NOTE — Progress Notes (Unsigned)
   New Patient Visit   Subjective  Tara Avery is a 63 y.o. female who presents for the following: Crusty moles that she would like checked - right breast, right buttock and back. The one on he right breast and right buttock seem to be getting crustier. No history of skin cancer.    The following portions of the chart were reviewed this encounter and updated as appropriate: medications, allergies, medical history  Review of Systems:  No other skin or systemic complaints except as noted in HPI or Assessment and Plan.  Objective  Well appearing patient in no apparent distress; mood and affect are within normal limits.   A focused examination was performed of the following areas: Back, buttock, right breast   Relevant exam findings are noted in the Assessment and Plan.  Mid Back 1.0 cm irregular brown papule   Assessment & Plan   SEBORRHEIC KERATOSIS - Stuck-on, waxy, tan-brown papules and/or plaques  - Benign-appearing - Discussed benign etiology and prognosis. - Observe - Call for any changes  NEOPLASM OF UNCERTAIN BEHAVIOR OF SKIN Mid Back Epidermal / dermal shaving  Lesion diameter (cm):  1 Informed consent: discussed and consent obtained   Timeout: patient name, date of birth, surgical site, and procedure verified   Procedure prep:  Patient was prepped and draped in usual sterile fashion Prep type:  Isopropyl alcohol Anesthesia: the lesion was anesthetized in a standard fashion   Anesthetic:  1% lidocaine w/ epinephrine 1-100,000 buffered w/ 8.4% NaHCO3 Instrument used: flexible razor blade   Hemostasis achieved with: pressure, aluminum chloride and electrodesiccation   Outcome: patient tolerated procedure well   Post-procedure details: sterile dressing applied and wound care instructions given   Dressing type: bandage and petrolatum    Specimen 1 - Surgical pathology Differential Diagnosis: R/O dysplastic nevus  Check Margins:  No  Dermatitis  Exam: Scaly pink papules coalescing to plaques of left forearm  Treatment Plan: Recommend 1% hydrocortisone cream twice daily x 2 weeks.     Return if symptoms worsen or fail to improve.  I, Roseline Hutchinson, CMA, am acting as scribe for [employer], DO .   Documentation: I have reviewed the above documentation for accuracy and completeness, and I agree with the above.  Delon Lenis, DO

## 2024-07-01 ENCOUNTER — Encounter: Payer: Self-pay | Admitting: Dermatology

## 2024-07-01 ENCOUNTER — Ambulatory Visit: Payer: PRIVATE HEALTH INSURANCE | Admitting: Radiology

## 2024-07-05 LAB — SURGICAL PATHOLOGY

## 2024-07-06 ENCOUNTER — Ambulatory Visit: Payer: Self-pay | Admitting: Dermatology

## 2024-07-11 ENCOUNTER — Other Ambulatory Visit: Payer: Self-pay | Admitting: Family Medicine

## 2024-07-11 DIAGNOSIS — I10 Essential (primary) hypertension: Secondary | ICD-10-CM

## 2024-07-12 MED ORDER — LOSARTAN POTASSIUM 100 MG PO TABS: 100.0000 mg | ORAL_TABLET | Freq: Every day | ORAL | 3 refills | Status: AC

## 2024-07-12 NOTE — Addendum Note (Signed)
 Addended by: BERNETA ELSIE LABOR on: 07/12/2024 11:54 AM   Modules accepted: Orders

## 2024-07-25 ENCOUNTER — Encounter: Payer: Self-pay | Admitting: Family Medicine

## 2024-07-27 ENCOUNTER — Other Ambulatory Visit: Payer: Self-pay | Admitting: Family Medicine

## 2024-07-27 DIAGNOSIS — Z1211 Encounter for screening for malignant neoplasm of colon: Secondary | ICD-10-CM

## 2024-07-29 ENCOUNTER — Encounter: Payer: Self-pay | Admitting: Family Medicine

## 2024-07-30 ENCOUNTER — Encounter: Payer: Self-pay | Admitting: Obstetrics and Gynecology

## 2024-07-30 NOTE — Progress Notes (Unsigned)
 GYNECOLOGY  VISIT   HPI: 63 y.o.   Married  Caucasian female   G1P1 with No LMP recorded. Patient has had a hysterectomy.   here for: New GYN Referral- Abnormal CT scan of abdomen & pelvis. 02/02/24 - Left-sided adnexal cyst. Hx of ovarian cyst. Hysterectomy for heavy menses and pain in 2002.  Dx with endometriosis.  She had an ovarian cyst drained at the time of the surgery.    Has had stomach trouble since she had Covid last year.  This prompted the CT scan.    Occasional right upper and lower quadrant pain prior to the CT begin done.  No pain now for 4 months.    No left lower quadrant pain.   She is asking about having her hormone testing done.  She took estrogen for 6 years and stopped due to breast cysts.    Does report some anxiety.   Has used vaginal estrogen in the past and developed yeast infections.  KY jelly not working well.  Asking for natural options.  Dryness with sex.        Study Result  Narrative & Impression  CLINICAL DATA:  Right upper quadrant pain   EXAM: CT ABDOMEN AND PELVIS WITH CONTRAST   TECHNIQUE: Multidetector CT imaging of the abdomen and pelvis was performed using the standard protocol following bolus administration of intravenous contrast.   RADIATION DOSE REDUCTION: This exam was performed according to the departmental dose-optimization program which includes automated exposure control, adjustment of the mA and/or kV according to patient size and/or use of iterative reconstruction technique.   CONTRAST:  100mL ISOVUE -300 IOPAMIDOL  (ISOVUE -300) INJECTION 61%   COMPARISON:  Abdominal ultrasound January 18, 2024   FINDINGS: Lower chest: No infiltrates or consolidations, no pleural effusions   Hepatobiliary: Liver normal size no masses no biliary dilatation. Gallbladder unremarkable. No gallstones.   Pancreas: Pancreas normal size. No masses calcifications or inflammatory changes.   Spleen: Spleen normal size.  No masses.    Adrenals/Urinary Tract: Adrenal glands are normal size. Follow-up recommended. Kidneys are normal. No masses calcifications or hydronephrosis   Stomach/Bowel: No small or large bowel obstruction or inflammatory changes.   Moderate amount of residual fecal material throughout the colon without obstruction or constipation.   Vascular/Lymphatic: No significant vascular findings are present. No enlarged abdominal or pelvic lymph nodes.   Reproductive: Status post hysterectomy. Within the region of the left adnexa a poorly defined area of decreased attenuation measures 2.4 by 2.0 cm and could correlate with a left adnexal cyst. No other adnexal masses. No inflammatory changes of the lower abdomen and pelvis.   Other: Anterior abdominal wall unremarkable without evidence of umbilical or inguinal hernias   Musculoskeletal: Visualized portion of the thoracolumbar spine and pelvic structures grossly unremarkable without evidence of fracture bony abnormalities or soft tissue masses.   IMPRESSION: *No acute findings in the abdomen or pelvis. *Moderate amount of residual fecal material throughout the colon without obstruction or constipation. *Status post hysterectomy. *Within the region of the left adnexa a poorly defined area of decreased attenuation measures 2.4 by 2.0 cm and could correlate with a left adnexal cyst. No other adnexal masses. No inflammatory changes of the lower abdomen and pelvis.     Electronically Signed   By: Franky Chard M.D.   On: 02/03/2024 10:05     GYNECOLOGIC HISTORY: No LMP recorded. Patient has had a hysterectomy. 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   ETD (Eustachian tube dysfunction), bilateral 05/26/2023   COVID 05/26/2023   Hot flashes, menopausal 02/20/2023   Osteoarthritis of hand, primary localized, 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   Costochondritis 02/12/2022   Osteopenia of neck of femur 12/28/2021   Essential hypertension 12/28/2021   Atypical nevi 05/24/2021   Stress 05/24/2021   Elevated BP without diagnosis of hypertension 05/24/2021   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   History of psoriasis 05/20/2019   History of sarcoidosis 05/20/2019   Allergic rhinitis 05/20/2019   Sciatica of left side 05/20/2019   Arthralgia 05/20/2019   Elevated LDL cholesterol level 01/07/2018   Healthcare maintenance 01/07/2018   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   Vitamin D  deficiency 11/14/2008   Menopausal and postmenopausal disorder 09/04/2006   Mixed hyperlipidemia 09/04/2006   Other chronic allergic conjunctivitis 09/04/2006   Other psoriasis 09/04/2006    Past Medical History:  Diagnosis Date   Allergy    Arthritis    Chicken pox    Depression    Emphysema of lung (HCC)    when young   Endometriosis    Fibroid    Frequent headaches    GERD (gastroesophageal reflux disease)    Hyperlipidemia    Migraine    Ovarian cyst    Urinary tract infection     Past Surgical History:  Procedure Laterality Date   ABDOMINAL HYSTERECTOMY  2002   partial   BUNIONECTOMY Left 06/1996    Current Outpatient Medications  Medication Sig Dispense Refill   atorvastatin  (LIPITOR) 10 MG tablet Take 1 tablet (10 mg total) by mouth daily. 90 tablet 3   calcium  carbonate (TUMS - DOSED IN MG ELEMENTAL CALCIUM ) 500 MG chewable tablet Chew 1 tablet by mouth daily.     cetirizine (ZYRTEC) 10 MG tablet  Take 10 mg by mouth daily.     Cholecalciferol (VITAMIN D3) 2000 units TABS Take 1 tablet by mouth daily.     diphenhydrAMINE (BENADRYL) 25 MG tablet Take 25 mg by mouth as needed.     ibuprofen (ADVIL,MOTRIN) 200 MG tablet Take 200 mg by mouth as needed.     losartan  (COZAAR ) 100 MG tablet Take 1 tablet (100 mg total) by mouth daily. 90 tablet 3   MELATONIN ER PO Take by mouth.     omeprazole  (PRILOSEC) 20 MG capsule Take 1 capsule (20 mg total) by mouth daily. 30 capsule 3   polyethylene glycol powder (GLYCOLAX /MIRALAX ) 17 GM/SCOOP powder Take 17 g by mouth daily. As needed for constipation 850 g 0   Simethicone (GAS-X PO) Take 1 tablet by mouth as needed.     triamcinolone  ointment (KENALOG ) 0.1 % Apply 1 Application topically  2 (two) times daily. 30 g 0   Alum Hydroxide-Mag Trisilicate (GAVISCON) 80-14.2 MG CHEW  (Patient not taking: Reported on 08/04/2024)     Multiple Vitamins-Minerals (CENTRUM WOMEN PO) Take 0.5 tablets by mouth daily. (Patient not taking: Reported on 08/04/2024)     No current facility-administered medications for this visit.     ALLERGIES: Prednisone , Codeine, and Latex  Family History  Problem Relation Age of Onset   Alcohol abuse Mother    Arthritis Mother    Depression Mother    Hyperlipidemia Mother    Hypertension Mother    Kidney disease Mother    Alcohol abuse Father    Early death Father    Aneurysm Father    Depression Sister    Diabetes Sister    Heart attack Sister    Hyperlipidemia Sister    Hypertension Sister    Miscarriages / [place] Sister    Stroke Sister    Healthy Daughter    Heart attack Maternal Grandfather    Hypertension Maternal Grandfather    Hyperlipidemia Maternal Grandfather    Arthritis Sister     Social History   Socioeconomic History   Marital status: Married    Spouse name: Not on file   Number of children: Not on file   Years of education: Not on file   Highest education level: Associate degree: academic  program  Occupational History   Not on file  Tobacco Use   Smoking status: Never    Passive exposure: Past   Smokeless tobacco: Never  Vaping Use   Vaping status: Never Used  Substance and Sexual Activity   Alcohol use: No    Comment: rarely   Drug use: No   Sexual activity: Yes    Partners: Male    Birth control/protection: Post-menopausal    Comment: 1st intercourse- 18, partner- 1, married- 38 yrs   Other Topics Concern   Not on file  Social History Narrative   Not on file   Social Drivers of Health   Financial Resource Strain: Low Risk  (06/14/2024)   Overall Financial Resource Strain (CARDIA)    Difficulty of Paying Living Expenses: Not hard at all  Food Insecurity: No Food Insecurity (06/14/2024)   Hunger Vital Sign    Worried About Running Out of Food in the Last Year: Never true    Ran Out of Food in the Last Year: Never true  Transportation Needs: No Transportation Needs (06/14/2024)   PRAPARE - Administrator, Civil Service (Medical): No    Lack of Transportation (Non-Medical): No  Physical Activity: Insufficiently Active (06/14/2024)   Exercise Vital Sign    Days of Exercise per Week: 3 days    Minutes of Exercise per Session: 10 min  Stress: No Stress Concern Present (06/14/2024)   [employer] of Occupational Health - Occupational Stress Questionnaire    Feeling of Stress: Only a little  Social Connections: Moderately Isolated (06/14/2024)   Social Connection and Isolation Panel    Frequency of Communication with Friends and Family: More than three times a week    Frequency of Social Gatherings with Friends and Family: Patient declined    Attends Religious Services: Never    Database administrator or Organizations: No    Attends Engineer, structural: Not on file    Marital Status: Married  Catering manager Violence: Not on file    Review of Systems  All other systems reviewed and are negative.   PHYSICAL  EXAMINATION:   BP  114/76 (BP Location: Left Arm, Patient Position: Sitting)   Pulse 85   SpO2 98%     General appearance: alert, cooperative and appears stated age Head: Normocephalic, without obvious abnormality, atraumatic Neck: no adenopathy, supple, symmetrical, trachea midline and thyroid  normal to inspection and palpation Lungs: clear to auscultation bilaterally Breasts: normal appearance, no masses or tenderness, No nipple retraction or dimpling, No nipple discharge or bleeding, No axillary or supraclavicular adenopathy Heart: regular rate and rhythm Abdomen: soft, non-tender, no masses,  no organomegaly Extremities: extremities normal, atraumatic, no cyanosis or edema Skin: Skin color, texture, turgor normal. No rashes or lesions Lymph nodes: Cervical, supraclavicular, and axillary nodes normal. No abnormal inguinal nodes palpated Neurologic: Grossly normal  Pelvic: External genitalia:  no lesions              Urethra:  normal appearing urethra with no masses, tenderness or lesions              Bartholins and Skenes: normal                 Vagina: normal appearing vagina with normal color and discharge, no lesions              Cervix: no lesions                Bimanual Exam:  Uterus:  normal size, contour, position, consistency, mobility, non-tender              Adnexa: no mass, fullness, tenderness              Rectal exam: {yes no:314532}.  Confirms.              Anus:  normal sphincter tone, no lesions  Chaperone was present for exam:  {BSCHAPERONE:31226::Emily F, CMA}  ASSESSMENT:  Status TVH in 2002 in [place] .   Anxiety and hot flashes.  Menopausal symptoms.   PLAN:    {LABS (Optional):23779}  ***  total time was spent for this patient encounter, including preparation, face-to-face counseling with the patient, coordination of care, and documentation of the encounter.

## 2024-08-04 ENCOUNTER — Ambulatory Visit (INDEPENDENT_AMBULATORY_CARE_PROVIDER_SITE_OTHER): Payer: PRIVATE HEALTH INSURANCE | Admitting: Obstetrics and Gynecology

## 2024-08-04 ENCOUNTER — Encounter: Payer: Self-pay | Admitting: Obstetrics and Gynecology

## 2024-08-04 VITALS — BP 114/76 | HR 85

## 2024-08-04 DIAGNOSIS — N951 Menopausal and female climacteric states: Secondary | ICD-10-CM

## 2024-08-04 DIAGNOSIS — N952 Postmenopausal atrophic vaginitis: Secondary | ICD-10-CM

## 2024-08-04 DIAGNOSIS — N9489 Other specified conditions associated with female genital organs and menstrual cycle: Secondary | ICD-10-CM

## 2024-08-05 ENCOUNTER — Ambulatory Visit: Payer: Self-pay | Admitting: Obstetrics and Gynecology

## 2024-08-05 ENCOUNTER — Encounter: Payer: Self-pay | Admitting: Obstetrics and Gynecology

## 2024-08-05 LAB — CA 125: CA 125: 17 U/mL (ref <35)

## 2024-08-06 NOTE — Telephone Encounter (Signed)
 Dr. Nikki -please advise once OV note signed.

## 2024-08-10 ENCOUNTER — Other Ambulatory Visit: Payer: PRIVATE HEALTH INSURANCE

## 2024-08-16 ENCOUNTER — Other Ambulatory Visit: Payer: Self-pay

## 2024-08-16 DIAGNOSIS — Z1211 Encounter for screening for malignant neoplasm of colon: Secondary | ICD-10-CM

## 2024-08-21 ENCOUNTER — Encounter: Payer: Self-pay | Admitting: Obstetrics and Gynecology

## 2024-08-23 NOTE — Telephone Encounter (Signed)
 Per review of EPIC, patient is scheduled for PUS on 08/24/24, please assist with benefits.

## 2024-08-24 ENCOUNTER — Ambulatory Visit (INDEPENDENT_AMBULATORY_CARE_PROVIDER_SITE_OTHER): Payer: PRIVATE HEALTH INSURANCE

## 2024-08-24 DIAGNOSIS — N9489 Other specified conditions associated with female genital organs and menstrual cycle: Secondary | ICD-10-CM

## 2024-08-30 ENCOUNTER — Ambulatory Visit (INDEPENDENT_AMBULATORY_CARE_PROVIDER_SITE_OTHER): Payer: PRIVATE HEALTH INSURANCE | Admitting: Obstetrics and Gynecology

## 2024-08-30 ENCOUNTER — Encounter: Payer: Self-pay | Admitting: Obstetrics and Gynecology

## 2024-08-30 VITALS — BP 124/80 | HR 78

## 2024-08-30 DIAGNOSIS — N9489 Other specified conditions associated with female genital organs and menstrual cycle: Secondary | ICD-10-CM

## 2024-08-30 DIAGNOSIS — N952 Postmenopausal atrophic vaginitis: Secondary | ICD-10-CM

## 2024-08-30 NOTE — Progress Notes (Unsigned)
 GYNECOLOGY  VISIT   HPI: 63 y.o.   Married  Caucasian female   G1P1 with No LMP recorded. Patient has had a hysterectomy.   here for: U/S Consult    Has left adnexal masses not well defined with CT and pelvic US .   Hx endometriosis.      CA125 17.   Findings: Pelvic US  08/24/24 Uterus absent.  Left ovary 4.26 x 2.43 x 2.32 cm.  Several residual follicles.  2.9 x 1.6 cm avascular complex cyst with debris.  Right ovary 3.22 x 1.71 x 1.65 cm.  Several simple residual follicles, largest measuring 10 x 8 mm.  Simple cystic structure separate from the ovary 1.7 x 1.6 cm, possible paratubal cyst.  No free fluid.   States her pelvic ultrasound was painful.   She has anxiety with imaging like CT due to claustrophobia.     GYNECOLOGIC HISTORY: No LMP recorded. Patient has had a hysterectomy. Contraception:  PMP Menopausal hormone therapy:  n/a Last 2 paps:  07/21/19 neg History of abnormal Pap or positive HPV:  no Mammogram:  11/12/21 Breast Density Cat C, BIRADS Cat 1 neg          OB History     Gravida  1   Para  1   Term      Preterm      AB      Living  1      SAB      IAB      Ectopic      Multiple      Live Births  1              Patient Active Problem List   Diagnosis Date Noted   Flexural eczema 06/14/2024   Gastroesophageal reflux disease 08/19/2023   Dysphagia 08/19/2023   Irritable bowel syndrome with diarrhea 08/19/2023   ETD (Eustachian tube dysfunction), bilateral 05/26/2023   COVID 05/26/2023   Hot flashes, menopausal 02/20/2023   Osteoarthritis of hand, primary localized, 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   Costochondritis 02/12/2022   Osteopenia of neck of femur 12/28/2021   Essential hypertension 12/28/2021   Atypical nevi 05/24/2021   Stress 05/24/2021   Elevated BP without diagnosis of hypertension 05/24/2021   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   History of psoriasis 05/20/2019   History of sarcoidosis 05/20/2019   Allergic rhinitis 05/20/2019    Sciatica of left side 05/20/2019   Arthralgia 05/20/2019   Elevated LDL cholesterol level 01/07/2018   Healthcare maintenance 01/07/2018   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   Vitamin D  deficiency 11/14/2008   Menopausal and postmenopausal disorder 09/04/2006   Mixed hyperlipidemia 09/04/2006   Other chronic allergic conjunctivitis 09/04/2006   Other psoriasis 09/04/2006    Past Medical History:  Diagnosis Date   Allergy    Arthritis    Chicken pox    Depression    Emphysema of lung (HCC)    when young   Endometriosis    Fibroid    Frequent headaches    GERD (gastroesophageal reflux disease)    Hyperlipidemia    Migraine    Ovarian cyst    Urinary tract infection     Past Surgical History:  Procedure Laterality Date   ABDOMINAL HYSTERECTOMY  2002   partial   BUNIONECTOMY Left 06/1996    Current Outpatient Medications  Medication Sig Dispense Refill   atorvastatin  (LIPITOR) 10 MG tablet Take 1 tablet (10 mg total) by mouth daily. 90 tablet 3   calcium  carbonate (  TUMS - DOSED IN MG ELEMENTAL CALCIUM ) 500 MG chewable tablet Chew 1 tablet by mouth daily.     cetirizine (ZYRTEC) 10 MG tablet Take 10 mg by mouth daily.     Cholecalciferol (VITAMIN D3) 2000 units TABS Take 1 tablet by mouth daily.     diphenhydrAMINE (BENADRYL) 25 MG tablet Take 25 mg by mouth as needed.     ibuprofen (ADVIL,MOTRIN) 200 MG tablet Take 200 mg by mouth as needed.     losartan  (COZAAR ) 100 MG tablet Take 1 tablet (100 mg total) by mouth daily. 90 tablet 3   MELATONIN ER PO Take by mouth.     omeprazole  (PRILOSEC) 20 MG capsule Take 1 capsule (20 mg total) by mouth daily. 30 capsule 3   polyethylene glycol powder (GLYCOLAX /MIRALAX ) 17 GM/SCOOP powder Take 17 g by mouth daily. As needed for constipation 850 g 0   Simethicone (GAS-X PO) Take 1 tablet by mouth as needed.     triamcinolone  ointment (KENALOG ) 0.1 % Apply 1 Application topically 2 (two) times daily. 30 g 0   No current  facility-administered medications for this visit.     ALLERGIES: Prednisone , Codeine, and Latex  Family History  Problem Relation Age of Onset   Alcohol abuse Mother    Arthritis Mother    Depression Mother    Hyperlipidemia Mother    Hypertension Mother    Kidney disease Mother    Alcohol abuse Father    Early death Father    Aneurysm Father    Depression Sister    Diabetes Sister    Heart attack Sister    Hyperlipidemia Sister    Hypertension Sister    Miscarriages / [place] Sister    Stroke Sister    Healthy Daughter    Heart attack Maternal Grandfather    Hypertension Maternal Grandfather    Hyperlipidemia Maternal Grandfather    Arthritis Sister     Social History   Socioeconomic History   Marital status: Married    Spouse name: Not on file   Number of children: Not on file   Years of education: Not on file   Highest education level: Associate degree: academic program  Occupational History   Not on file  Tobacco Use   Smoking status: Never    Passive exposure: Past   Smokeless tobacco: Never  Vaping Use   Vaping status: Never Used  Substance and Sexual Activity   Alcohol use: No    Comment: rarely   Drug use: No   Sexual activity: Yes    Partners: Male    Birth control/protection: Post-menopausal    Comment: 1st intercourse- 18, partner- 1, married- 40 yrs   Other Topics Concern   Not on file  Social History Narrative   Not on file   Social Drivers of Health   Financial Resource Strain: Low Risk  (06/14/2024)   Overall Financial Resource Strain (CARDIA)    Difficulty of Paying Living Expenses: Not hard at all  Food Insecurity: No Food Insecurity (06/14/2024)   Hunger Vital Sign    Worried About Running Out of Food in the Last Year: Never true    Ran Out of Food in the Last Year: Never true  Transportation Needs: No Transportation Needs (06/14/2024)   PRAPARE - Administrator, Civil Service (Medical): No    Lack of Transportation  (Non-Medical): No  Physical Activity: Insufficiently Active (06/14/2024)   Exercise Vital Sign    Days of Exercise per Week:  3 days    Minutes of Exercise per Session: 10 min  Stress: No Stress Concern Present (06/14/2024)   [employer] of Occupational Health - Occupational Stress Questionnaire    Feeling of Stress: Only a little  Social Connections: Moderately Isolated (06/14/2024)   Social Connection and Isolation Panel    Frequency of Communication with Friends and Family: More than three times a week    Frequency of Social Gatherings with Friends and Family: Patient declined    Attends Religious Services: Never    Database administrator or Organizations: No    Attends Engineer, structural: Not on file    Marital Status: Married  Catering manager Violence: Not on file    Review of Systems  All other systems reviewed and are negative.   PHYSICAL EXAMINATION:   BP 124/80 (BP Location: Left Arm, Patient Position: Sitting)   Pulse 78   SpO2 98%     General appearance: alert, cooperative and appears stated age Head: Normocephalic, without obvious abnormality, atraumatic Neck: no adenopathy, supple, symmetrical, trachea midline and thyroid  normal to inspection and palpation Lungs: clear to auscultation bilaterally Breasts: normal appearance, no masses or tenderness, No nipple retraction or dimpling, No nipple discharge or bleeding, No axillary or supraclavicular adenopathy Heart: regular rate and rhythm Abdomen: soft, non-tender, no masses,  no organomegaly Extremities: extremities normal, atraumatic, no cyanosis or edema Skin: Skin color, texture, turgor normal. No rashes or lesions Lymph nodes: Cervical, supraclavicular, and axillary nodes normal. No abnormal inguinal nodes palpated Neurologic: Grossly normal  Pelvic: External genitalia:  no lesions              Urethra:  normal appearing urethra with no masses, tenderness or lesions              Bartholins and  Skenes: normal                 Vagina: normal appearing vagina with normal color and discharge, no lesions              Cervix: no lesions               ASSESSMENT:  Left adnexal mass, not well defined by prior imaging.    Claustrophobia.   Vaginal atrophy.   PLAN:  Proceed with pelvic MRI.   Consider valium 2 mg or xanax for the pelvic MRI.   She will update mammogram.  Consider Intrarosa.    {LABS (Optional):23779}  ***  total time was spent for this patient encounter, including preparation, face-to-face counseling with the patient, coordination of care, and documentation of the encounter.

## 2024-08-30 NOTE — Patient Instructions (Signed)
 Prasterone Vaginal insert What is this medication? PRASTERONE (PRAS ter one), also known as DEHYDROEPIANDROSTERONE (DHEA) reduces vaginal pain during sex due to menopause. It works by increasing levels of the hormone estrogen in the body. This medicine may be used for other purposes; ask your health care provider or pharmacist if you have questions. COMMON BRAND NAME(S): INTRAROSA What should I tell my care team before I take this medication? They need to know if you have any of these conditions: Cancer, such as breast, uterine, or other cancer History of vaginal bleeding An unusual or allergic reaction to prasterone, DHEA, other hormones, medications, foods, dyes, or preservatives Pregnant or trying to get pregnant Breast-feeding How should I use this medication? This medication is for vaginal use only. Do not take by mouth. Follow the directions on the prescription label. Read package directions carefully before using. Wash hands before and after use. Use this medication at bedtime. Do not use it more often than directed. Do not stop using except on your care team's advice. Talk to your care team about the use of this medication in children. This medication is not approved for use in children. Overdosage: If you think you have taken too much of this medicine contact a poison control center or emergency room at once. NOTE: This medicine is only for you. Do not share this medicine with others. What if I miss a dose? If you miss a dose, use it as soon as you can. If it is almost time for your next dose, use only that dose. Do not use double or extra doses. What may interact with this medication? Interactions are not expected. Do not use any other vaginal products without telling your care team. This list may not describe all possible interactions. Give your health care provider a list of all the medicines, herbs, non-prescription drugs, or dietary supplements you use. Also tell them if you smoke,  drink alcohol, or use illegal drugs. Some items may interact with your medicine. What should I watch for while using this medication? Visit your care team for a regular check on your progress. This medication may cause changes on a cervical Pap smear. You will receive regular pelvic exams. What side effects may I notice from receiving this medication? Side effects that you should report to your care team as soon as possible: Allergic reactions--skin rash, itching, hives, swelling of the face, lips, tongue, or throat Unusual vaginal discharge, itching, or odor Vaginal bleeding after menopause, pelvic pain Side effects that usually do not require medical attention (report to your care team if they continue or are bothersome): Vaginal discharge Vaginal irritation at application site This list may not describe all possible side effects. Call your doctor for medical advice about side effects. You may report side effects to FDA at 1-800-FDA-1088. Where should I keep my medication? Keep out of the reach of children and pets. Store at room temperature or in a refrigerator between 5 and 30 degrees C (41 and 86 degrees F). Throw away any unused medication after the expiration date. NOTE: This sheet is a summary. It may not cover all possible information. If you have questions about this medicine, talk to your doctor, pharmacist, or health care provider.  2024 Elsevier/Gold Standard (2021-10-03 00:00:00)

## 2024-09-01 LAB — COLOGUARD: COLOGUARD: NEGATIVE

## 2024-09-02 ENCOUNTER — Ambulatory Visit: Payer: Self-pay

## 2024-09-06 ENCOUNTER — Encounter: Payer: Self-pay | Admitting: Obstetrics and Gynecology

## 2024-09-07 ENCOUNTER — Other Ambulatory Visit: Payer: Self-pay | Admitting: Obstetrics and Gynecology

## 2024-09-07 DIAGNOSIS — Z1231 Encounter for screening mammogram for malignant neoplasm of breast: Secondary | ICD-10-CM

## 2024-09-19 ENCOUNTER — Encounter: Payer: Self-pay | Admitting: Family Medicine

## 2024-09-22 ENCOUNTER — Ambulatory Visit: Payer: PRIVATE HEALTH INSURANCE | Admitting: Obstetrics and Gynecology

## 2024-09-22 ENCOUNTER — Ambulatory Visit
Admission: RE | Admit: 2024-09-22 | Discharge: 2024-09-22 | Disposition: A | Payer: PRIVATE HEALTH INSURANCE | Source: Ambulatory Visit

## 2024-09-22 ENCOUNTER — Encounter: Payer: Self-pay | Admitting: Obstetrics and Gynecology

## 2024-09-22 ENCOUNTER — Other Ambulatory Visit: Payer: Self-pay | Admitting: Obstetrics and Gynecology

## 2024-09-22 DIAGNOSIS — Z1231 Encounter for screening mammogram for malignant neoplasm of breast: Secondary | ICD-10-CM

## 2024-09-22 MED ORDER — DIAZEPAM 2 MG PO TABS: ORAL_TABLET | ORAL | 0 refills | Status: DC

## 2024-09-23 ENCOUNTER — Telehealth: Payer: Self-pay

## 2024-09-23 ENCOUNTER — Encounter: Payer: Self-pay | Admitting: Obstetrics and Gynecology

## 2024-09-23 NOTE — Telephone Encounter (Signed)
 Encounter reviewed and closed.

## 2024-09-26 ENCOUNTER — Ambulatory Visit
Admission: RE | Admit: 2024-09-26 | Discharge: 2024-09-26 | Disposition: A | Discharge status: Home / Self Care | Payer: PRIVATE HEALTH INSURANCE | Source: Ambulatory Visit | Attending: Obstetrics and Gynecology | Admitting: Obstetrics and Gynecology

## 2024-09-26 DIAGNOSIS — N9489 Other specified conditions associated with female genital organs and menstrual cycle: Secondary | ICD-10-CM

## 2024-09-26 MED ORDER — GADOPICLENOL 0.5 MMOL/ML IV SOLN
7.0000 mL | Freq: Once | INTRAVENOUS | Status: AC | PRN
  Administered 2024-09-26: 7 mL via INTRAVENOUS

## 2024-09-27 ENCOUNTER — Ambulatory Visit: Payer: Self-pay | Admitting: Obstetrics and Gynecology

## 2024-09-27 ENCOUNTER — Encounter: Payer: Self-pay | Admitting: Radiology

## 2024-09-28 ENCOUNTER — Encounter: Payer: Self-pay | Admitting: Family Medicine

## 2024-09-28 ENCOUNTER — Encounter: Payer: PRIVATE HEALTH INSURANCE | Admitting: Family Medicine

## 2024-09-28 ENCOUNTER — Ambulatory Visit (INDEPENDENT_AMBULATORY_CARE_PROVIDER_SITE_OTHER): Payer: PRIVATE HEALTH INSURANCE | Admitting: Family Medicine

## 2024-09-28 VITALS — BP 136/84 | HR 82 | Temp 98.6°F | Ht 65.0 in | Wt 147.0 lb

## 2024-09-28 DIAGNOSIS — Z79899 Other long term (current) drug therapy: Secondary | ICD-10-CM

## 2024-09-28 DIAGNOSIS — Z0000 Encounter for general adult medical examination without abnormal findings: Secondary | ICD-10-CM | POA: Diagnosis not present

## 2024-09-28 DIAGNOSIS — I10 Essential (primary) hypertension: Secondary | ICD-10-CM | POA: Diagnosis not present

## 2024-09-28 DIAGNOSIS — E7800 Pure hypercholesterolemia, unspecified: Secondary | ICD-10-CM

## 2024-09-28 DIAGNOSIS — Z131 Encounter for screening for diabetes mellitus: Secondary | ICD-10-CM

## 2024-09-28 DIAGNOSIS — K219 Gastro-esophageal reflux disease without esophagitis: Secondary | ICD-10-CM

## 2024-09-28 NOTE — Progress Notes (Signed)
 Established Patient Office Visit   Subjective:  Patient ID: Tara Avery, female    DOB: September 18, 1961  Age: 63 y.o. MRN: [id number]  No chief complaint on file.   HPI Encounter Diagnoses  Name Primary?   Healthcare maintenance Yes   Essential hypertension    Elevated LDL cholesterol level    Gastroesophageal reflux disease, unspecified whether esophagitis present    Screening for diabetes mellitus    Current use of proton pump inhibitor    For physical and follow-up of above.  Continues losartan  for controlled hypertension.  Continues atorvastatin  for elevated cholesterol.  Omeprazole  20 mg is controlling her reflux symptoms.  One of her stepsisters had an abnormal EKG and another stepsister was diagnosed with a problem with one of her heart valves.  Patient is having no shortness of breath or chest pain.   Review of Systems  Constitutional: Negative.   HENT: Negative.    Eyes:  Negative for blurred vision, discharge and redness.  Respiratory: Negative.  Negative for shortness of breath.   Cardiovascular: Negative.  Negative for chest pain.  Gastrointestinal:  Negative for abdominal pain.  Genitourinary: Negative.   Musculoskeletal: Negative.  Negative for myalgias.  Skin:  Negative for rash.  Neurological:  Negative for tingling, loss of consciousness and weakness.  Endo/Heme/Allergies:  Negative for polydipsia.      09/28/2024   11:42 AM 03/28/2023    1:43 PM 01/22/2023   10:06 AM  Depression screen PHQ 2/9  Decreased Interest 0 0 0  Down, Depressed, Hopeless 0 0 0  PHQ - 2 Score 0 0 0  Altered sleeping 0    Tired, decreased energy 0    Change in appetite 0    Feeling bad or failure about yourself  0    Trouble concentrating 0    Moving slowly or fidgety/restless 0    Suicidal thoughts 0    PHQ-9 Score 0    Difficult doing work/chores Not difficult at all        Current Outpatient Medications:    diazepam (VALIUM) 2 MG tablet, Take one tablet (2 mg) by mouth  1 hour prior to procedure., Disp: 1 tablet, Rfl: 0   atorvastatin  (LIPITOR) 10 MG tablet, Take 1 tablet (10 mg total) by mouth daily., Disp: 90 tablet, Rfl: 3   calcium  carbonate (TUMS - DOSED IN MG ELEMENTAL CALCIUM ) 500 MG chewable tablet, Chew 1 tablet by mouth daily., Disp: , Rfl:    cetirizine (ZYRTEC) 10 MG tablet, Take 10 mg by mouth daily., Disp: , Rfl:    Cholecalciferol (VITAMIN D3) 2000 units TABS, Take 1 tablet by mouth daily., Disp: , Rfl:    diphenhydrAMINE (BENADRYL) 25 MG tablet, Take 25 mg by mouth as needed., Disp: , Rfl:    ibuprofen (ADVIL,MOTRIN) 200 MG tablet, Take 200 mg by mouth as needed., Disp: , Rfl:    losartan  (COZAAR ) 100 MG tablet, Take 1 tablet (100 mg total) by mouth daily., Disp: 90 tablet, Rfl: 3   MELATONIN ER PO, Take by mouth., Disp: , Rfl:    omeprazole  (PRILOSEC) 20 MG capsule, Take 1 capsule (20 mg total) by mouth daily., Disp: 30 capsule, Rfl: 3   polyethylene glycol powder (GLYCOLAX /MIRALAX ) 17 GM/SCOOP powder, Take 17 g by mouth daily. As needed for constipation, Disp: 850 g, Rfl: 0   Simethicone (GAS-X PO), Take 1 tablet by mouth as needed., Disp: , Rfl:    triamcinolone  ointment (KENALOG ) 0.1 %, Apply 1 Application topically 2 (two)  times daily., Disp: 30 g, Rfl: 0   Objective:     BP 136/84 (BP Location: Right Arm, Patient Position: Sitting, Cuff Size: Normal)   Pulse 82   Temp 98.6 F (37 C) (Temporal)   Ht 5' 5 (1.651 m)   Wt 147 lb (66.7 kg)   SpO2 98%   BMI 24.46 kg/m    Physical Exam Constitutional:      General: She is not in acute distress.    Appearance: Normal appearance. She is not ill-appearing, toxic-appearing or diaphoretic.  HENT:     Head: Normocephalic and atraumatic.     Right Ear: Tympanic membrane, ear canal and external ear normal.     Left Ear: Tympanic membrane, ear canal and external ear normal.     Mouth/Throat:     Mouth: Mucous membranes are moist.     Pharynx: Oropharynx is clear. No oropharyngeal exudate  or posterior oropharyngeal erythema.  Eyes:     General: No scleral icterus.       Right eye: No discharge.        Left eye: No discharge.     Extraocular Movements: Extraocular movements intact.     Conjunctiva/sclera: Conjunctivae normal.     Pupils: Pupils are equal, round, and reactive to light.  Cardiovascular:     Rate and Rhythm: Normal rate and regular rhythm.     Heart sounds: No murmur heard.    No friction rub. No gallop.  Pulmonary:     Effort: Pulmonary effort is normal. No respiratory distress.     Breath sounds: Normal breath sounds. No wheezing or rales.  Abdominal:     General: Bowel sounds are normal.     Tenderness: There is no right CVA tenderness or left CVA tenderness.  Musculoskeletal:     Cervical back: No rigidity or tenderness.  Skin:    General: Skin is warm and dry.  Neurological:     Mental Status: She is alert and oriented to person, place, and time.  Psychiatric:        Mood and Affect: Mood normal.        Behavior: Behavior normal.      No results found for any visits on 09/28/24.    The 10-year ASCVD risk score (Arnett DK, et al., 2019) is: 6.8%    Assessment & Plan:   Healthcare maintenance  Essential hypertension -     CBC with Differential/Platelet; Future -     Comprehensive metabolic panel with GFR; Future -     Urinalysis, Routine w reflex microscopic; Future -     EKG 12-Lead  Elevated LDL cholesterol level -     Comprehensive metabolic panel with GFR; Future -     Lipid panel; Future  Gastroesophageal reflux disease, unspecified whether esophagitis present  Screening for diabetes mellitus -     Comprehensive metabolic panel with GFR; Future -     Hemoglobin A1c; Future  Current use of proton pump inhibitor -     Vitamin B12; Future    Return in about 6 months (around 03/28/2025), or Return fasting for blood work..  Continue exercising and regular dental care.  Continue follow-up with GYN for complex ovarian cyst.   Information given on managing hypertension.  Information given on health maintenance and disease prevention.  EKG showed normal sinus rhythm.  There were no ST segment or T wave depressions or elevations.  Elsie Sim Lent, MD

## 2024-09-29 ENCOUNTER — Encounter: Payer: Self-pay | Admitting: Family Medicine

## 2024-10-01 ENCOUNTER — Other Ambulatory Visit (INDEPENDENT_AMBULATORY_CARE_PROVIDER_SITE_OTHER): Payer: PRIVATE HEALTH INSURANCE

## 2024-10-01 ENCOUNTER — Ambulatory Visit: Payer: Self-pay | Admitting: Family Medicine

## 2024-10-01 DIAGNOSIS — Z79899 Other long term (current) drug therapy: Secondary | ICD-10-CM | POA: Diagnosis not present

## 2024-10-01 DIAGNOSIS — E7800 Pure hypercholesterolemia, unspecified: Secondary | ICD-10-CM

## 2024-10-01 DIAGNOSIS — Z131 Encounter for screening for diabetes mellitus: Secondary | ICD-10-CM | POA: Diagnosis not present

## 2024-10-01 DIAGNOSIS — I10 Essential (primary) hypertension: Secondary | ICD-10-CM | POA: Diagnosis not present

## 2024-10-01 LAB — COMPREHENSIVE METABOLIC PANEL WITH GFR
ALT: 12 U/L (ref 0–35)
AST: 17 U/L (ref 0–37)
Albumin: 4 g/dL (ref 3.5–5.2)
Alkaline Phosphatase: 76 U/L (ref 39–117)
BUN: 10 mg/dL (ref 6–23)
CO2: 29 meq/L (ref 19–32)
Calcium: 8.9 mg/dL (ref 8.4–10.5)
Chloride: 100 meq/L (ref 96–112)
Creatinine, Ser: 0.7 mg/dL (ref 0.40–1.20)
GFR: 91.1 mL/min (ref >60.00)
Glucose, Bld: 89 mg/dL (ref 70–99)
Potassium: 4.1 meq/L (ref 3.5–5.1)
Sodium: 138 meq/L (ref 135–145)
Total Bilirubin: 0.4 mg/dL (ref 0.2–1.2)
Total Protein: 7.1 g/dL (ref 6.0–8.3)

## 2024-10-01 LAB — LIPID PANEL
Cholesterol: 123 mg/dL (ref 0–200)
HDL: 42.1 mg/dL (ref >39.00)
LDL Cholesterol: 60 mg/dL (ref 0–99)
NonHDL: 81.14
Total CHOL/HDL Ratio: 3
Triglycerides: 108 mg/dL (ref 0.0–149.0)
VLDL: 21.6 mg/dL (ref 0.0–40.0)

## 2024-10-01 LAB — CBC WITH DIFFERENTIAL/PLATELET
Basophils Absolute: 0 K/uL (ref 0.0–0.1)
Basophils Relative: 0.8 % (ref 0.0–3.0)
Eosinophils Absolute: 0.1 K/uL (ref 0.0–0.7)
Eosinophils Relative: 1.3 % (ref 0.0–5.0)
HCT: 39.2 % (ref 36.0–46.0)
Hemoglobin: 13.1 g/dL (ref 12.0–15.0)
Lymphocytes Relative: 29.6 % (ref 12.0–46.0)
Lymphs Abs: 1.8 K/uL (ref 0.7–4.0)
MCHC: 33.5 g/dL (ref 30.0–36.0)
MCV: 87 fl (ref 78.0–100.0)
Monocytes Absolute: 0.4 K/uL (ref 0.1–1.0)
Monocytes Relative: 6.7 % (ref 3.0–12.0)
Neutro Abs: 3.8 K/uL (ref 1.4–7.7)
Neutrophils Relative %: 61.6 % (ref 43.0–77.0)
Platelets: 285 K/uL (ref 150.0–400.0)
RBC: 4.51 Mil/uL (ref 3.87–5.11)
RDW: 13.6 % (ref 11.5–15.5)
WBC: 6.2 K/uL (ref 4.0–10.5)

## 2024-10-01 LAB — HEMOGLOBIN A1C: Hgb A1c MFr Bld: 5.7 % (ref 4.6–6.5)

## 2024-10-01 LAB — VITAMIN B12: Vitamin B-12: 207 pg/mL — ABNORMAL LOW (ref 211–911)

## 2024-10-04 ENCOUNTER — Other Ambulatory Visit: Payer: Self-pay | Admitting: Obstetrics and Gynecology

## 2024-10-04 LAB — URINALYSIS, ROUTINE W REFLEX MICROSCOPIC
Bilirubin Urine: NEGATIVE
Ketones, ur: NEGATIVE
Leukocytes,Ua: NEGATIVE
Nitrite: NEGATIVE
RBC / HPF: NONE SEEN (ref 0–?)
Specific Gravity, Urine: <=1.005 — AB (ref 1.000–1.030)
Total Protein, Urine: NEGATIVE
Urine Glucose: NEGATIVE
Urobilinogen, UA: 0.2 (ref 0.0–1.0)
WBC, UA: NONE SEEN (ref 0–?)
pH: >=9 — AB (ref 5.0–8.0)

## 2024-10-04 MED ORDER — INTRAROSA 6.5 MG VA INST: 6.5000 mg | VAGINAL_INSERT | Freq: Every evening | VAGINAL | 2 refills | Status: DC

## 2024-10-05 ENCOUNTER — Other Ambulatory Visit: Payer: Self-pay | Admitting: Obstetrics and Gynecology

## 2024-10-05 MED ORDER — ESTRADIOL 10 MCG VA TABS: 1.0000 | ORAL_TABLET | VAGINAL | 1 refills | Status: DC

## 2024-10-05 NOTE — Telephone Encounter (Signed)
 Medication refill request: intrarosa 6.5mg  Rx sent yesterday. Pharmacy comment says alternative requested: prefers premarin  vaginal cream. Aex: none MMG: 09-22-24 birads 1:neg Last ov: 08-30-24 Please approve or deny as appropriate

## 2024-10-06 ENCOUNTER — Other Ambulatory Visit: Payer: Self-pay

## 2024-10-06 NOTE — Telephone Encounter (Signed)
 Alternative requested: (Non-formulary item)  Intrarosa 6.5 mg vag insert Place 6.5 mg vaginally at bedtime  Date written:  10/04/24   Disp:   28.0 Refills:    Last AEX:  07/21/2019 Last OV: 08/30/2024 Next AEX:  Not yet scheduled Last MMG (if hormonal med):  N/A Refill authorized? Please Advise.

## 2024-10-20 ENCOUNTER — Other Ambulatory Visit: Payer: Self-pay

## 2024-10-20 NOTE — Telephone Encounter (Signed)
 Medication refill request:estradiol  vaginal tab  Last OV:  08/30/24  Next AEX: 12/21/24 Last MMG (if hormonal medication request): 09/26/24 bi-rads 1 neg  Refill authorized: patient states that she will need a refill before her appointment.

## 2024-10-25 ENCOUNTER — Ambulatory Visit: Payer: Self-pay

## 2024-10-25 ENCOUNTER — Encounter: Payer: Self-pay | Admitting: Obstetrics and Gynecology

## 2024-10-25 ENCOUNTER — Encounter: Payer: Self-pay | Admitting: Emergency Medicine

## 2024-10-25 ENCOUNTER — Ambulatory Visit (INDEPENDENT_AMBULATORY_CARE_PROVIDER_SITE_OTHER): Payer: Medicare (Managed Care) | Admitting: Emergency Medicine

## 2024-10-25 VITALS — BP 134/82 | HR 87 | Temp 98.9°F | Resp 16 | Ht 65.0 in | Wt 147.0 lb

## 2024-10-25 DIAGNOSIS — N390 Urinary tract infection, site not specified: Secondary | ICD-10-CM

## 2024-10-25 DIAGNOSIS — R300 Dysuria: Secondary | ICD-10-CM | POA: Diagnosis not present

## 2024-10-25 LAB — POCT URINALYSIS DIPSTICK
Bilirubin, UA: NEGATIVE
Glucose, UA: NEGATIVE
Ketones, UA: NEGATIVE
Nitrite, UA: NEGATIVE
Protein, UA: NEGATIVE
Spec Grav, UA: 1.01 (ref 1.010–1.025)
Urobilinogen, UA: 0.2 U/dL
pH, UA: 5 (ref 5.0–8.0)

## 2024-10-25 MED ORDER — NITROFURANTOIN MONOHYD MACRO 100 MG PO CAPS: 100.0000 mg | ORAL_CAPSULE | Freq: Two times a day (BID) | ORAL | 0 refills | Status: AC

## 2024-10-25 NOTE — Telephone Encounter (Signed)
 Spoke with patient. Patient reports burning with urination, feeling achy after voiding and urinary frequency. States she has been on Yuvafem  for approximately 3 weeks, currently using twice weekly. States Yuvafem  has been working, noticing some pink on the vagina.  Denies fever/chills, N/V, flank pain, vaginal d/c, bleeding or odor.   Patient states she has new insurance and may need to see PCP.   Patient states she thought she would start with GYN since UTI may be a side effect of the yuvafem . Offered OV on 12/2, patient states she was hoping to get in today. Advised next available 12/2. Instructed patient to f/u with PCP for OV, if f/u needed with GYN return call. Advised I will send to covering provider for review, our office will f/u if any additional recommendations. Patient agreeable.  Routing to covering provider, Dr. Dallie.   Cc: Dr. Nikki

## 2024-10-25 NOTE — Progress Notes (Signed)
 Assessment & Plan:   1. Acute UTI (Primary) - POCT urinalysis dipstick - Urine Culture - nitrofurantoin, macrocrystal-monohydrate, (MACROBID) 100 MG capsule; Take 1 capsule (100 mg total) by mouth 2 (two) times daily for 7 days.  Dispense: 14 capsule; Refill: 0     Results for orders placed or performed in visit on 10/25/24  POCT urinalysis dipstick  Result Value Ref Range   Color, UA     Clarity, UA     Glucose, UA Negative Negative   Bilirubin, UA neg    Ketones, UA neg    Spec Grav, UA 1.010 1.010 - 1.025   Blood, UA +++    pH, UA 5.0 5.0 - 8.0   Protein, UA Negative Negative   Urobilinogen, UA 0.2 0.2 or 1.0 E.U./dL   Nitrite, UA neg    Leukocytes, UA Moderate (2+) (A) Negative   Appearance     Odor      Urine dipstick as above shows positive for RBCs and positive for leukocytes.  Push hydration, avoid holding urine, avoid bladder irritants.   Urgent recheck for acute fever, flank pain, emesis, or other new concerns   Corean Geralds, MSPAS, PA-C     Subjective:  HPI:   Tara Avery is a 63 y.o. female who complains of suspected UTI Sx starting today Dysuria Denies fever, flank pain, emesis, acute vaginal sx.       ROS: Pertinent components reviewed in HPI and are otherwise negative unless specifically indicated above.   Relevant past medical history reviewed and updated as indicated.   Allergies and medications reviewed and updated.   Current Outpatient Medications:    atorvastatin  (LIPITOR) 10 MG tablet, Take 1 tablet (10 mg total) by mouth daily., Disp: 90 tablet, Rfl: 3   calcium  carbonate (TUMS - DOSED IN MG ELEMENTAL CALCIUM ) 500 MG chewable tablet, Chew 1 tablet by mouth daily., Disp: , Rfl:    cetirizine (ZYRTEC) 10 MG tablet, Take 10 mg by mouth daily., Disp: , Rfl:    Cholecalciferol (VITAMIN D3) 2000 units TABS, Take 1 tablet by mouth daily., Disp: , Rfl:    diphenhydrAMINE (BENADRYL) 25 MG tablet, Take 25 mg by mouth as needed., Disp: ,  Rfl:    Estradiol  (VAGIFEM ) 10 MCG TABS vaginal tablet, Place 1 tablet (10 mcg total) vaginally 2 (two) times a week. Use every night before bed for two weeks when you first begin this medicine, then after the first two weeks, begin using it twice a week., Disp: 34 tablet, Rfl: 1   ibuprofen (ADVIL,MOTRIN) 200 MG tablet, Take 200 mg by mouth as needed., Disp: , Rfl:    losartan  (COZAAR ) 100 MG tablet, Take 1 tablet (100 mg total) by mouth daily., Disp: 90 tablet, Rfl: 3   MELATONIN ER PO, Take by mouth., Disp: , Rfl:    Multiple Vitamin (MULTIVITAMIN) tablet, Take 1 tablet by mouth daily., Disp: , Rfl:    nitrofurantoin, macrocrystal-monohydrate, (MACROBID) 100 MG capsule, Take 1 capsule (100 mg total) by mouth 2 (two) times daily for 7 days., Disp: 14 capsule, Rfl: 0   polyethylene glycol powder (GLYCOLAX /MIRALAX ) 17 GM/SCOOP powder, Take 17 g by mouth daily. As needed for constipation, Disp: 850 g, Rfl: 0   Simethicone (GAS-X PO), Take 1 tablet by mouth as needed., Disp: , Rfl:    triamcinolone  ointment (KENALOG ) 0.1 %, Apply 1 Application topically 2 (two) times daily., Disp: 30 g, Rfl: 0  Allergies  Allergen Reactions   Prednisone  Palpitations  Patient more recently reported sob while taking it. I consider this to be more of a significant reaction.    Codeine    Latex              Objective:   Vitals:   10/25/24 1318  BP: 134/82  Pulse: 87  Temp: 98.9 F (37.2 C)  Resp: 16  Height: 5' 5 (1.651 m)  Weight: 147 lb (66.7 kg)  SpO2: 99%  BMI (Calculated): 24.46     General: Appears well, in no acute distress. VS as above.  Head: Oral mucosa moist  CV: RRR  Pulm: Normal respiratory effort and excursion  Abd: soft, non-tender, no guarding, normal bowel sounds. No CVAT.  Gait normal

## 2024-10-25 NOTE — Patient Instructions (Signed)
 Take the antibiotics as directed.   Use the pyridium (over the counter is AZO for pain/phenazopyridine) to help with symptoms.   You should be seen again if symptoms persist after treatment, sooner if you develop worsening symptoms to include fever, back pain, abdominal pain, nausea, or vomiting.

## 2024-10-25 NOTE — Telephone Encounter (Signed)
 Noted

## 2024-10-25 NOTE — Telephone Encounter (Signed)
 FYI Only or Action Required?: FYI only for provider: appointment scheduled on 10/25/24.  Patient was last seen in primary care on 09/28/2024 by Berneta Elsie Sayre, MD.  Called Nurse Triage reporting Dysuria.  Symptoms began today.  Interventions attempted: Nothing.  Symptoms are: stable.  Triage Disposition: See Physician Within 24 Hours  Patient/caregiver understands and will follow disposition?: Yes Reason for Disposition  Age > 50 years  Answer Assessment - Initial Assessment Questions Has been taking estrogen tablets vaginally for vaginal dryness for about 1 month. Patient stated she's never had to be seen for a UTI before. Advised patient to come in to get a Urinalysis done to see exactly what is going on. If abx are needed, they will be prescribed.  1. SEVERITY: How bad is the pain?  (e.g., Scale 1-10; mild, moderate, or severe)     Pretty bad  2. FREQUENCY: How many times have you had painful urination today?      Urinary frequency, going about 15-20 minutes  3. PATTERN: Is pain present every time you urinate or just sometimes?      Yes  4. ONSET: When did the painful urination start?      Today  5. FEVER: Do you have a fever? If Yes, ask: What is your temperature, how was it measured, and when did it start?     Denies  6. PAST UTI: Have you had a urine infection before? If Yes, ask: When was the last time? and What happened that time?      Over a year ago  7. CAUSE: What do you think is causing the painful urination?  (e.g., UTI, scratch, Herpes sore)     UTI  8. OTHER SYMPTOMS: Do you have any other symptoms? (e.g., blood in urine, flank pain, genital sores, urgency, vaginal discharge)     Urgency  9. PREGNANCY: Is there any chance you are pregnant? When was your last menstrual period?     N/a  Protocols used: Urination Pain - Female-A-AH  Copied from CRM #[id number]. Topic: Clinical - Red Word Triage >> Oct 25, 2024 10:45 AM  Mesmerise C wrote: [facility] that prompted transfer to Nurse Triage: Patient stated she has a UTI believes it's from the new estrogen she's taking, frequently urinating 15-20 minutes, burning when urinating been ongoing since this morning states this weekend she felt soreness and dryness

## 2024-10-26 ENCOUNTER — Other Ambulatory Visit: Payer: Self-pay | Admitting: Obstetrics and Gynecology

## 2024-10-27 NOTE — Telephone Encounter (Signed)
 Med refill request:   Estradiol  (VAGIFEM ) 10 MCG TABS vaginal tablet  Start:  10/07/24 Disp:  34 tablets Refills:  1  Last AEX:  07/21/19 Last OV: 08/30/24 Next OV: 12/28/24 Next AEX:  Not yet scheduled  Last MMG (if hormonal med):  09/22/24 Refill authorized? Please Advise.

## 2024-10-28 ENCOUNTER — Other Ambulatory Visit: Payer: Self-pay | Admitting: Obstetrics and Gynecology

## 2024-10-28 MED ORDER — ESTRADIOL 10 MCG VA TABS: 1.0000 | ORAL_TABLET | VAGINAL | 1 refills | Status: DC

## 2024-10-28 NOTE — Progress Notes (Signed)
 New Rx for Vagifem  10 mcg vaginally twice a week at bedtime.  Disp: 24, RF 1.

## 2024-10-29 LAB — URINE CULTURE
MICRO NUMBER:: 17295535
SPECIMEN QUALITY:: ADEQUATE

## 2024-11-01 ENCOUNTER — Ambulatory Visit: Payer: Self-pay | Admitting: Emergency Medicine

## 2024-12-08 ENCOUNTER — Encounter: Payer: Self-pay | Admitting: Obstetrics and Gynecology

## 2024-12-09 ENCOUNTER — Encounter: Payer: Self-pay | Admitting: Family Medicine

## 2024-12-09 NOTE — Telephone Encounter (Signed)
 Left message to call [facility] Triage at [phone], option 4.

## 2024-12-09 NOTE — Telephone Encounter (Signed)
 Spoke with patient, offered OV with Dr. Nikki on 12/10/24, patient declines. Patient states she is scheduled for OV 12/28/24. Patient states she is still using Yuvafem , symptoms not improving, would like Dr. Cyrilla recommendations prior to scheduling another OV. Patient denies urinary frequency, urgency, fever/chills, N/V, flank pain. Advised our office will f/u with recommendations once reviewed. Instructed patient to return call if new symptoms develop prior to [facility] returning call. Patient agreeable.

## 2024-12-17 ENCOUNTER — Ambulatory Visit: Payer: Medicare (Managed Care) | Admitting: Radiology

## 2024-12-17 ENCOUNTER — Encounter: Payer: Self-pay | Admitting: Radiology

## 2024-12-17 ENCOUNTER — Encounter: Payer: Self-pay | Admitting: Family Medicine

## 2024-12-17 VITALS — BP 168/90 | HR 84 | Wt 150.0 lb

## 2024-12-17 DIAGNOSIS — N952 Postmenopausal atrophic vaginitis: Secondary | ICD-10-CM

## 2024-12-17 DIAGNOSIS — B3731 Acute candidiasis of vulva and vagina: Secondary | ICD-10-CM

## 2024-12-17 LAB — WET PREP FOR TRICH, YEAST, CLUE

## 2024-12-17 MED ORDER — FLUCONAZOLE 150 MG PO TABS: 150.0000 mg | ORAL_TABLET | ORAL | 0 refills | Status: DC

## 2024-12-17 MED ORDER — ESTRADIOL 10 MCG VA TABS: 1.0000 | ORAL_TABLET | VAGINAL | 0 refills | Status: DC

## 2024-12-17 NOTE — Progress Notes (Signed)
" ° ° ° ° °  Subjective: Tara Avery is a 64 y.o. female who complains of vaginal redness, irritation, milky discharge (tried Monistat 3--started 12/11/24 without relief). Uses vaginal estradiol  but insurance does not cover, >$300 at her pharmacy.    Review of Systems  All other systems reviewed and are negative.   Past Medical History:  Diagnosis Date   Allergy    Anxiety    past year with closed in spaces   Arthritis    Chicken pox    Depression    Emphysema of lung (HCC)    when young   Endometriosis    Fibroid    Frequent headaches    GERD (gastroesophageal reflux disease)    Hyperlipidemia    Migraine    Ovarian cyst    Urinary tract infection       Objective:  Today's Vitals   12/17/24 1440 12/17/24 1448  BP: (!) 162/94 (!) 168/90  Pulse: 84   SpO2: 97%   Weight: 150 lb (68 kg)    Body mass index is 24.96 kg/m.   Physical Exam Vitals and nursing note reviewed. Exam conducted with a chaperone present.  Constitutional:      Appearance: Normal appearance. She is well-developed.  Pulmonary:     Effort: Pulmonary effort is normal.  Abdominal:     General: Abdomen is flat.     Palpations: Abdomen is soft.  Genitourinary:    General: Normal vulva.     Vagina: Vaginal discharge present. No erythema, bleeding or lesions.     Uterus: Absent.   Neurological:     Mental Status: She is alert.  Psychiatric:        Mood and Affect: Mood normal.        Thought Content: Thought content normal.        Judgment: Judgment normal.     Microscopic wet-mount exam shows large amounts of hyphae.   [name], CMA present for exam  Assessment:/Plan:   1. Yeast vaginitis (Primary) - fluconazole  (DIFLUCAN ) 150 MG tablet; Take 1 tablet (150 mg total) by mouth every 3 (three) days.  Dispense: 3 tablet; Refill: 0 - WET PREP FOR TRICH, YEAST, CLUE  2. Vaginal atrophy - Estradiol  10 MCG TABS vaginal tablet; Place 1 tablet (10 mcg total) vaginally 2 (two) times a week.   Dispense: 30 tablet; Refill: 0 [facility] tabs    Avoid intercourse until symptoms are resolved. Safe sex encouraged. Avoid the use of soaps or perfumed products in the peri area. Avoid tub baths and sitting in sweaty or wet clothing for prolonged periods of time.     Dayon Witt B, NP 3:06 PM  "

## 2024-12-17 NOTE — Telephone Encounter (Signed)
 Spoke to patient and offered to have her transferred up  to the front to see if someone can help schedule her at another office.  She declined and then advised that the other option is to go to an Urgent care to be evaluated.  She will wait till after lunch and make a decision on what she would like to do.  Dm/cma

## 2024-12-21 ENCOUNTER — Ambulatory Visit: Payer: PRIVATE HEALTH INSURANCE | Admitting: Obstetrics and Gynecology

## 2024-12-28 ENCOUNTER — Ambulatory Visit: Payer: PRIVATE HEALTH INSURANCE | Admitting: Obstetrics and Gynecology

## 2024-12-28 ENCOUNTER — Encounter: Payer: Self-pay | Admitting: Obstetrics and Gynecology

## 2024-12-28 VITALS — BP 122/84 | HR 79 | Ht 66.0 in | Wt 149.0 lb

## 2024-12-28 DIAGNOSIS — Z01419 Encounter for gynecological examination (general) (routine) without abnormal findings: Secondary | ICD-10-CM | POA: Diagnosis not present

## 2024-12-28 DIAGNOSIS — N760 Acute vaginitis: Secondary | ICD-10-CM

## 2024-12-28 DIAGNOSIS — N952 Postmenopausal atrophic vaginitis: Secondary | ICD-10-CM | POA: Diagnosis not present

## 2024-12-28 DIAGNOSIS — B3731 Acute candidiasis of vulva and vagina: Secondary | ICD-10-CM

## 2024-12-28 DIAGNOSIS — M85852 Other specified disorders of bone density and structure, left thigh: Secondary | ICD-10-CM

## 2024-12-28 DIAGNOSIS — Z780 Asymptomatic menopausal state: Secondary | ICD-10-CM

## 2024-12-28 LAB — WET PREP FOR TRICH, YEAST, CLUE

## 2024-12-28 MED ORDER — ESTRADIOL 10 MCG VA TABS: 1.0000 | ORAL_TABLET | VAGINAL | 2 refills | Status: AC

## 2024-12-28 MED ORDER — FLUCONAZOLE 150 MG PO TABS: ORAL_TABLET | ORAL | 0 refills | Status: AC

## 2024-12-28 MED ORDER — ESTRADIOL 10 MCG VA TABS: 1.0000 | ORAL_TABLET | VAGINAL | 3 refills | Status: DC

## 2024-12-29 ENCOUNTER — Other Ambulatory Visit: Payer: Self-pay | Admitting: Obstetrics and Gynecology

## 2024-12-29 DIAGNOSIS — N83202 Unspecified ovarian cyst, left side: Secondary | ICD-10-CM

## 2025-04-05 ENCOUNTER — Other Ambulatory Visit: Payer: Medicare (Managed Care)

## 2025-04-05 ENCOUNTER — Other Ambulatory Visit: Payer: Medicare (Managed Care) | Admitting: Obstetrics and Gynecology
